# Patient Record
Sex: Male | Born: 1960 | Race: White | Hispanic: No | Marital: Married | Smoking: Former smoker
Health system: Southern US, Community
[De-identification: ages and names within clinical notes are randomized; demographics above are authoritative.]

## PROBLEM LIST (undated history)

## (undated) DIAGNOSIS — Z972 Presence of dental prosthetic device (complete) (partial): Secondary | ICD-10-CM

## (undated) DIAGNOSIS — F32A Depression, unspecified: Secondary | ICD-10-CM

## (undated) DIAGNOSIS — R519 Headache, unspecified: Secondary | ICD-10-CM

## (undated) DIAGNOSIS — K08109 Complete loss of teeth, unspecified cause, unspecified class: Secondary | ICD-10-CM

## (undated) DIAGNOSIS — K219 Gastro-esophageal reflux disease without esophagitis: Secondary | ICD-10-CM

## (undated) DIAGNOSIS — Z973 Presence of spectacles and contact lenses: Secondary | ICD-10-CM

## (undated) DIAGNOSIS — F329 Major depressive disorder, single episode, unspecified: Secondary | ICD-10-CM

## (undated) DIAGNOSIS — R51 Headache: Secondary | ICD-10-CM

## (undated) DIAGNOSIS — G8929 Other chronic pain: Secondary | ICD-10-CM

## (undated) HISTORY — DX: Headache: R51

## (undated) HISTORY — DX: Headache, unspecified: R51.9

## (undated) HISTORY — PX: WISDOM TOOTH EXTRACTION: SHX21

## (undated) HISTORY — DX: Complete loss of teeth, unspecified cause, unspecified class: K08.109

## (undated) HISTORY — PX: POLYPECTOMY: SHX149

## (undated) HISTORY — DX: Depression, unspecified: F32.A

## (undated) HISTORY — DX: Major depressive disorder, single episode, unspecified: F32.9

## (undated) HISTORY — DX: Complete loss of teeth, unspecified cause, unspecified class: Z97.2

## (undated) HISTORY — DX: Presence of spectacles and contact lenses: Z97.3

## (undated) HISTORY — DX: Gastro-esophageal reflux disease without esophagitis: K21.9

## (undated) HISTORY — DX: Other chronic pain: G89.29

---
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
## 1998-03-05 ENCOUNTER — Emergency Department (HOSPITAL_COMMUNITY): Admission: EM | Admit: 1998-03-05 | Discharge: 1998-03-05 | Payer: Self-pay | Admitting: Emergency Medicine

## 1999-05-09 ENCOUNTER — Encounter: Payer: Self-pay | Admitting: Family Medicine

## 1999-05-09 ENCOUNTER — Ambulatory Visit (HOSPITAL_COMMUNITY): Admission: RE | Admit: 1999-05-09 | Discharge: 1999-05-09 | Payer: Self-pay | Admitting: Family Medicine

## 1999-05-13 ENCOUNTER — Ambulatory Visit (HOSPITAL_COMMUNITY): Admission: RE | Admit: 1999-05-13 | Discharge: 1999-05-13 | Payer: Self-pay | Admitting: Family Medicine

## 1999-05-13 ENCOUNTER — Encounter: Payer: Self-pay | Admitting: Family Medicine

## 1999-10-26 ENCOUNTER — Emergency Department (HOSPITAL_COMMUNITY): Admission: EM | Admit: 1999-10-26 | Discharge: 1999-10-26 | Payer: Self-pay | Admitting: *Deleted

## 2000-10-01 ENCOUNTER — Emergency Department (HOSPITAL_COMMUNITY): Admission: EM | Admit: 2000-10-01 | Discharge: 2000-10-02 | Payer: Self-pay | Admitting: *Deleted

## 2003-07-22 ENCOUNTER — Emergency Department (HOSPITAL_COMMUNITY): Admission: EM | Admit: 2003-07-22 | Discharge: 2003-07-22 | Payer: Self-pay | Admitting: Emergency Medicine

## 2009-10-21 ENCOUNTER — Emergency Department (HOSPITAL_COMMUNITY): Admission: EM | Admit: 2009-10-21 | Discharge: 2009-10-21 | Payer: Self-pay | Admitting: Internal Medicine

## 2012-11-07 ENCOUNTER — Encounter: Payer: Self-pay | Admitting: Medical

## 2012-11-22 ENCOUNTER — Encounter: Payer: Self-pay | Admitting: Medical

## 2012-11-22 ENCOUNTER — Ambulatory Visit (INDEPENDENT_AMBULATORY_CARE_PROVIDER_SITE_OTHER): Payer: BC Managed Care – PPO | Admitting: Medical

## 2012-11-22 ENCOUNTER — Encounter: Payer: Self-pay | Admitting: Gastroenterology

## 2012-11-22 VITALS — BP 110/78 | HR 78 | Temp 98.2°F | Resp 16 | Ht 70.0 in | Wt 142.0 lb

## 2012-11-22 DIAGNOSIS — Z0000 Encounter for general adult medical examination without abnormal findings: Secondary | ICD-10-CM

## 2012-11-22 DIAGNOSIS — R51 Headache: Secondary | ICD-10-CM

## 2012-11-22 DIAGNOSIS — Z1211 Encounter for screening for malignant neoplasm of colon: Secondary | ICD-10-CM

## 2012-11-22 LAB — COMPREHENSIVE METABOLIC PANEL
ALT: 39 U/L (ref 0–53)
AST: 33 U/L (ref 0–37)
Albumin: 4.2 g/dL (ref 3.5–5.2)
Alkaline Phosphatase: 81 U/L (ref 39–117)
BUN: 21 mg/dL (ref 6–23)
CO2: 30 mEq/L (ref 19–32)
Chloride: 101 mEq/L (ref 96–112)
Creat: 0.94 mg/dL (ref 0.50–1.35)
Glucose, Bld: 93 mg/dL (ref 70–99)
Potassium: 4.3 mEq/L (ref 3.5–5.3)
Sodium: 140 mEq/L (ref 135–145)
Total Bilirubin: 0.6 mg/dL (ref 0.3–1.2)
Total Protein: 6.4 g/dL (ref 6.0–8.3)

## 2012-11-22 LAB — CBC WITH DIFFERENTIAL/PLATELET
Basophils Absolute: 0 10*3/uL (ref 0.0–0.1)
Basophils Relative: 0 % (ref 0–1)
Eosinophils Absolute: 0.1 10*3/uL (ref 0.0–0.7)
Eosinophils Relative: 3 % (ref 0–5)
HCT: 45.4 % (ref 39.0–52.0)
Hemoglobin: 15.9 g/dL (ref 13.0–17.0)
Lymphocytes Relative: 42 % (ref 12–46)
Lymphs Abs: 2.2 10*3/uL (ref 0.7–4.0)
MCH: 29.2 pg (ref 26.0–34.0)
MCHC: 35 g/dL (ref 30.0–36.0)
MCV: 83.5 fL (ref 78.0–100.0)
Monocytes Absolute: 0.3 10*3/uL (ref 0.1–1.0)
Monocytes Relative: 5 % (ref 3–12)
Neutro Abs: 2.6 10*3/uL (ref 1.7–7.7)
Neutrophils Relative %: 50 % (ref 43–77)
Platelets: 261 10*3/uL (ref 150–400)
RBC: 5.44 MIL/uL (ref 4.22–5.81)
RDW: 13.6 % (ref 11.5–15.5)
WBC: 5.2 10*3/uL (ref 4.0–10.5)

## 2012-11-22 LAB — POCT URINALYSIS DIPSTICK
Bilirubin, UA: NEGATIVE
Blood, UA: NEGATIVE
Glucose, UA: NEGATIVE
Ketones, UA: NEGATIVE
Leukocytes, UA: NEGATIVE
Protein, UA: NEGATIVE
Spec Grav, UA: 1.015
Urobilinogen, UA: NEGATIVE
pH, UA: 5

## 2012-11-22 LAB — TSH: TSH: 2.226 u[IU]/mL (ref 0.350–4.500)

## 2012-11-22 LAB — LIPID PANEL
Cholesterol: 173 mg/dL (ref 0–200)
HDL: 43 mg/dL (ref >39)
LDL Cholesterol: 114 mg/dL — ABNORMAL HIGH (ref 0–99)
Total CHOL/HDL Ratio: 4 Ratio
Triglycerides: 82 mg/dL (ref <150)
VLDL: 16 mg/dL (ref 0–40)

## 2012-11-22 NOTE — Patient Instructions (Signed)
Start keeping a headache diary.   Gradually cut back on caffeine intake, and try and get more sleep.    Lets see you back in 1-2 months regarding the headaches.    Preventative Care for Adults, Male       REGULAR HEALTH EXAMS:  A routine yearly physical is a good way to check in with your primary care provider about your health and preventive screening. It is also an opportunity to share updates about your health and any concerns you have, and receive a thorough all-over exam.   Most health insurance companies pay for at least some preventative services.  Check with your health plan for specific coverages.  WHAT PREVENTATIVE SERVICES DO MEN NEED?  Adult men should have their weight and blood pressure checked regularly.   Men age 82 and older should have their cholesterol levels checked regularly.  Beginning at age 40 and continuing to age 43, men should be screened for colorectal cancer.  Certain people should may need continued testing until age 27.  Other cancer screening may include exams for testicular and prostate cancer.  Updating vaccinations is part of preventative care.  Vaccinations help protect against diseases such as the flu.  Lab tests are generally done as part of preventative care to screen for anemia and blood disorders, to screen for problems with the kidneys and liver, to screen for bladder problems, to check blood sugar, and to check your cholesterol level.  Preventative services generally include counseling about diet, exercise, avoiding tobacco, drugs, excessive alcohol consumption, and sexually transmitted infections.    GENERAL RECOMMENDATIONS FOR GOOD HEALTH:  Healthy diet:  Eat a variety of foods, including fruit, vegetables, animal or vegetable protein, such as meat, fish, chicken, and eggs, or beans, lentils, tofu, and grains, such as rice.  Drink plenty of water daily.  Decrease saturated fat in the diet, avoid lots of red meat, processed foods,  sweets, fast foods, and fried foods.  Exercise:  Aerobic exercise helps maintain good heart health. At least 30-40 minutes of moderate-intensity exercise is recommended. For example, a brisk walk that increases your heart rate and breathing. This should be done on most days of the week.   Find a type of exercise or a variety of exercises that you enjoy so that it becomes a part of your daily life.  Examples are running, walking, swimming, water aerobics, and biking.  For motivation and support, explore group exercise such as aerobic class, spin class, Zumba, Yoga,or  martial arts, etc.    Set exercise goals for yourself, such as a certain weight goal, walk or run in a race such as a 5k walk/run.  Speak to your primary care provider about exercise goals.  Disease prevention:  If you smoke or chew tobacco, find out from your caregiver how to quit. It can literally save your life, no matter how long you have been a tobacco user. If you do not use tobacco, never begin.   Maintain a healthy diet and normal weight. Increased weight leads to problems with blood pressure and diabetes.   The Body Mass Index or BMI is a way of measuring how much of your body is fat. Having a BMI above 27 increases the risk of heart disease, diabetes, hypertension, stroke and other problems related to obesity. Your caregiver can help determine your BMI and based on it develop an exercise and dietary program to help you achieve or maintain this important measurement at a healthful level.  High blood  pressure causes heart and blood vessel problems.  Persistent high blood pressure should be treated with medicine if weight loss and exercise do not work.   Fat and cholesterol leaves deposits in your arteries that can block them. This causes heart disease and vessel disease elsewhere in your body.  If your cholesterol is found to be high, or if you have heart disease or certain other medical conditions, then you may need to have  your cholesterol monitored frequently and be treated with medication.   Ask if you should have a stress test if your history suggests this. A stress test is a test done on a treadmill that looks for heart disease. This test can find disease prior to there being a problem.  Avoid drinking alcohol in excess (more than two drinks per day).  Avoid use of street drugs. Do not share needles with anyone. Ask for professional help if you need assistance or instructions on stopping the use of alcohol, cigarettes, and/or drugs.  Brush your teeth twice a day with fluoride toothpaste, and floss once a day. Good oral hygiene prevents tooth decay and gum disease. The problems can be painful, unattractive, and can cause other health problems. Visit your dentist for a routine oral and dental check up and preventive care every 6-12 months.   Look at your skin regularly.  Use a mirror to look at your back. Notify your caregivers of changes in moles, especially if there are changes in shapes, colors, a size larger than a pencil eraser, an irregular border, or development of new moles.  Safety:  Use seatbelts 100% of the time, whether driving or as a passenger.  Use safety devices such as hearing protection if you work in environments with loud noise or significant background noise.  Use safety glasses when doing any work that could send debris in to the eyes.  Use a helmet if you ride a bike or motorcycle.  Use appropriate safety gear for contact sports.  Talk to your caregiver about gun safety.  Use sunscreen with a SPF (or skin protection factor) of 15 or greater.  Lighter skinned people are at a greater risk of skin cancer. Don't forget to also wear sunglasses in order to protect your eyes from too much damaging sunlight. Damaging sunlight can accelerate cataract formation.   Practice safe sex. Use condoms. Condoms are used for birth control and to help reduce the spread of sexually transmitted infections (or STIs).   Some of the STIs are gonorrhea (the clap), chlamydia, syphilis, trichomonas, herpes, HPV (human papilloma virus) and HIV (human immunodeficiency virus) which causes AIDS. The herpes, HIV and HPV are viral illnesses that have no cure. These can result in disability, cancer and death.   Keep carbon monoxide and smoke detectors in your home functioning at all times. Change the batteries every 6 months or use a model that plugs into the wall.   Vaccinations:  Stay up to date with your tetanus shots and other required immunizations. You should have a booster for tetanus every 10 years. Be sure to get your flu shot every year, since 5%-20% of the [place] population comes down with the flu. The flu vaccine changes each year, so being vaccinated once is not enough. Get your shot in the fall, before the flu season peaks.   Other vaccines to consider:  Pneumococcal vaccine to protect against certain types of pneumonia.  This is normally recommended for adults age 87 or older.  However, adults younger than 51  years old with certain underlying conditions such as diabetes, heart or lung disease should also receive the vaccine.  Shingles vaccine to protect against Varicella Zoster if you are older than age 40, or younger than 51 years old with certain underlying illness.  Hepatitis A vaccine to protect against a form of infection of the liver by a virus acquired from food.  Hepatitis B vaccine to protect against a form of infection of the liver by a virus acquired from blood or body fluids, particularly if you work in health care.  If you plan to travel internationally, check with your local health department for specific vaccination recommendations.  Cancer Screening:  Most routine colon cancer screening begins at the age of 41. On a yearly basis, doctors may provide special easy to use take-home tests to check for hidden blood in the stool. Sigmoidoscopy or colonoscopy can detect the earliest forms of colon  cancer and is life saving. These tests use a small camera at the end of a tube to directly examine the colon. Speak to your caregiver about this at age 67, when routine screening begins (and is repeated every 5 years unless early forms of pre-cancerous polyps or small growths are found).   At the age of 9 men usually start screening for prostate cancer every year. Screening may begin at a younger age for those with higher risk. Those at higher risk include African-Americans or having a family history of prostate cancer. There are two types of tests for prostate cancer:   Prostate-specific antigen (PSA) testing. Recent studies raise questions about prostate cancer using PSA and you should discuss this with your caregiver.   Digital rectal exam (in which your doctor's lubricated and gloved finger feels for enlargement of the prostate through the anus).   Screening for testicular cancer.  Do a monthly exam of your testicles. Gently roll each testicle between your thumb and fingers, feeling for any abnormal lumps. The best time to do this is after a hot shower or bath when the tissues are looser. Notify your caregivers of any lumps, tenderness or changes in size or shape immediately.

## 2012-11-22 NOTE — Progress Notes (Signed)
Subjective:   HPI  Justin Sellers is a 52 y.o. male who presents for a complete physical.  New patient today.  No prior recent medical care.   Preventative care: Last ophthalmology visit:08/2011-Dr. Gildardo Griffes [place] Last dental visit:Dr. Hyacinth Meeker Last colonoscopy: never Last prostate exam: never  Prior vaccinations: TD or Tdap:within 5 years Influenza:n/a Pneumococcal:n/a Shingles/Zostavax:n/a  Advanced directive:n/a Health care power of attorney:n/a Living will:n/a  Concerns: Chronic headaches.   Gets migraines every week, also has sinus headaches.   Has had this problem for at least 5 years.  Seems to be getting worse, gradually x last 5 years.   Headaches are usually right sided temporal, and sinus headaches in frontal area.  Feels like someone hitting him with hammer.  No photophobia, sometimes + phonophobia.  No fevers, no night sweats. At times gets nosebleeds, intermittent.   No frank numbness or tingling, but legs in general feel weak at times.   No hearing or vision changes.  headaches triggered by sleep issues.   Only gets 4 hours of sleep nightly for years.   No blood in stool.   Drinks 2+ pots of coffee daily, coffee at work, and coke during the day.  Has some nasal congestion.    Reviewed their medical, surgical, family, social, medication, and allergy history and updated chart as appropriate.   Past Medical History  Diagnosis Date  . Wears glasses   . Full dentures   . Chronic headache     History reviewed. No pertinent past surgical history.  Family History  Problem Relation Age of Onset  . Heart disease Mother 75    CABG  . Diabetes Mother   . Cancer Father     lung  . Hypertension Father   . Thyroid disease Sister   . Heart disease Paternal Grandmother   . Cancer Paternal Grandfather     lung  . Thyroid disease Sister     History   Social History  . Marital Status: Married    Spouse Name: N/A    Number of Children: N/A  . Years of Education: N/A    Occupational History  . Not on file.   Social History Main Topics  . Smoking status: Former Smoker -- 16.00 packs/day for 1.5 years    Types: Cigarettes    Quit date: 11/23/2010  . Smokeless tobacco: Not on file  . Alcohol Use: No  . Drug Use: No  . Sexually Active: Not on file   Other Topics Concern  . 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   No current facility-administered medications on file prior to visit.    No Known Allergies   Review of Systems Constitutional: -fever, -chills, -sweats, -unexpected weight change, -decreased appetite, -fatigue Allergy: -sneezing, -itching, -congestion Dermatology: -changing moles, --rash, +lumps ENT: -runny nose, -ear pain, -sore throat, -hoarseness, +sinus pain, -teeth pain, - ringing in ears, -hearing loss, -nosebleeds Cardiology: -chest pain, -palpitations, -swelling, -difficulty breathing when lying flat, -waking up short of breath Respiratory: -cough, -shortness of breath, -difficulty breathing with exercise or exertion, -wheezing, -coughing up blood Gastroenterology: -abdominal pain, -nausea, -vomiting, -diarrhea, -constipation, -blood in stool, -changes in bowel movement, -difficulty swallowing or eating Hematology: -bleeding, -bruising  Musculoskeletal: -joint aches, -muscle aches, -joint swelling, +back pain, -neck pain, -cramping, -changes in gait Ophthalmology: denies vision changes, eye redness,  itching, discharge Urology: -burning with urination, -difficulty urinating, -blood in urine, -urinary frequency, -urgency, -incontinence Neurology: +headache, -weakness, -tingling, -numbness, -memory loss, -falls, +dizziness Psychology: -depressed mood, -agitation, +sleep problems     Objective:   Physical Exam  Vitals and nurse notes reviewed  General  appearance: alert, no distress, WD/WN, lean white male Skin: scattered benign appearing lesions, no worrisome lesions HEENT: normocephalic, conjunctiva/corneas normal, sclerae anicteric, PERRLA, EOMi, nares patent, right nare bordering septum with raised 3mm growth unchnaged for years per pt, flesh colored, no discharge or erythema, pharynx normal Oral cavity: MMM, tongue normal, full dentures Neck: supple, no lymphadenopathy, no thyromegaly, no masses, normal ROM, no bruits Chest: non tender, normal shape and expansion Heart: RRR, normal S1, S2, no murmurs Lungs: CTA bilaterally, no wheezes, rhonchi, or rales Abdomen: +bs, soft, non tender, non distended, no masses, no hepatomegaly, no splenomegaly, no bruits Back: non tender, normal ROM, no scoliosis Musculoskeletal: upper extremities non tender, no obvious deformity, normal ROM throughout, lower extremities non tender, no obvious deformity, normal ROM throughout Extremities: no edema, no cyanosis, no clubbing Pulses: 2+ symmetric, upper and lower extremities, normal cap refill Neurological: alert, oriented x 3, CN2-12 intact, strength normal upper extremities and lower extremities, sensation normal throughout, DTRs 2+ throughout, no cerebellar signs, gait normal Psychiatric: normal affect, behavior normal, pleasant  GU: normal male external genitalia, nontender, no masses, no hernia, no lymphadenopathy Rectal: anus normal, tone normal, prostate WNL, occult negative stool   Adult ECG Report  Indication: physical  Rate: 62 bpm  Rhythm: normal sinus rhythm  QRS Axis: 78 degrees  PR Interval: 156 ms  QRS Duration: 84ms  QTc:  Conduction Disturbances: none  Other Abnormalities: none  Patient's cardiac risk factors are: male gender, mother with premature cardiac disease  EKG comparison: none  Narrative Interpretation: normal EKG    Assessment and Plan :    Encounter Diagnoses  Name Primary?  . Routine general medical  examination at a health care facility Yes  . Special screening for malignant neoplasms, colon   . Chronic headache     Physical exam - discussed healthy lifestyle, diet, exercise, preventative care, vaccinations, and addressed their concerns.    Referral for first screening colonoscopy  Chronic headache - begin keeping headache diary, gradually start cutting back on caffeine as discussed, try and get more sleep daily.  Recheck 1-2 mo.  Follow-up pending labs

## 2012-12-23 ENCOUNTER — Ambulatory Visit (AMBULATORY_SURGERY_CENTER): Payer: BC Managed Care – HMO | Admitting: *Deleted

## 2012-12-23 VITALS — Ht 69.0 in | Wt 144.0 lb

## 2012-12-23 DIAGNOSIS — Z1211 Encounter for screening for malignant neoplasm of colon: Secondary | ICD-10-CM

## 2012-12-23 MED ORDER — SUPREP BOWEL PREP KIT 17.5-3.13-1.6 GM/177ML PO SOLN: ORAL | Status: DC

## 2012-12-26 ENCOUNTER — Encounter: Payer: Self-pay | Admitting: Gastroenterology

## 2012-12-27 ENCOUNTER — Ambulatory Visit (INDEPENDENT_AMBULATORY_CARE_PROVIDER_SITE_OTHER): Payer: BC Managed Care – PPO | Admitting: Medical

## 2012-12-27 ENCOUNTER — Encounter: Payer: Self-pay | Admitting: Medical

## 2012-12-27 VITALS — BP 102/78 | HR 60 | Temp 97.8°F | Resp 16 | Wt 145.0 lb

## 2012-12-27 DIAGNOSIS — M7702 Medial epicondylitis, left elbow: Secondary | ICD-10-CM

## 2012-12-27 DIAGNOSIS — M7700 Medial epicondylitis, unspecified elbow: Secondary | ICD-10-CM

## 2012-12-27 DIAGNOSIS — R51 Headache: Secondary | ICD-10-CM

## 2012-12-27 DIAGNOSIS — M7020 Olecranon bursitis, unspecified elbow: Secondary | ICD-10-CM

## 2012-12-27 DIAGNOSIS — D1720 Benign lipomatous neoplasm of skin and subcutaneous tissue of unspecified limb: Secondary | ICD-10-CM

## 2012-12-27 DIAGNOSIS — I781 Nevus, non-neoplastic: Secondary | ICD-10-CM

## 2012-12-27 DIAGNOSIS — D1739 Benign lipomatous neoplasm of skin and subcutaneous tissue of other sites: Secondary | ICD-10-CM

## 2012-12-27 DIAGNOSIS — M7022 Olecranon bursitis, left elbow: Secondary | ICD-10-CM

## 2012-12-27 MED ORDER — DICLOFENAC SODIUM 75 MG PO TBEC: 75.0000 mg | DELAYED_RELEASE_TABLET | Freq: Two times a day (BID) | ORAL | Status: DC

## 2012-12-27 NOTE — Progress Notes (Signed)
Subjective:   HPI  Justin Sellers is a 52 y.o. male who presents for on chronic headaches.   Gets migraines every week, also has sinus headaches.   Has had this problem for at least 5 years.  Was getting regular headaches, but since last visit has cut down on caffeine and tried to work on sleep.  Doing better.  Not interested in medication for headaches at this time.   No photophobia, sometimes + phonophobia.  No fevers, no night sweats. At times gets nosebleeds, intermittent.   No frank numbness or tingling, but legs in general feel weak at times.   No hearing or vision changes.   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  Has concerns about his arm.  He reports lump in left shoulder and seeing some new skin lesions on chest, 3 different ones.   Wants these checked out.    Gets pains and weakness of left elbow, uses compression sleeve on the arm.  No other prior eval, no other aggravating or relieving factors.   Reviewed their medical, surgical, family, social, medication, and allergy history and updated chart as appropriate.  ROS as in subjective   Past Medical History  Diagnosis Date  . Wears glasses   . Full dentures   . Chronic headache     Objective:   Physical Exam  Vitals and nurse notes reviewed  General appearance: alert, no distress, WD/WN Neck: supple, no lymphadenopathy, no thyromegaly, no masses, normal ROM Pulses: 2+ symmetric, upper and lower extremities, normal cap refill Neurological: UE normal strength, DTRs, sensation MSK: mild tenderness left medial epicondyle and olecranon bursa, otherwise nontender, normal ROM, no other obvious deformity of UE   Assessment and Plan :    Encounter Diagnoses  Name Primary?  . Epicondylitis elbow, medial, left Yes  . Olecranon bursitis of left elbow   . Cherry angioma   . Lipoma of shoulder   . Chronic headaches     Epicondylitis and bursitis - mild.   Advised rest, ice, compression, Diclofenac script today, and recheck in  2-3 wk if not improving.  Handout given  Cherry angioma - reassured  Lipoma - small, reassured, advised no intervention at this time  chronic headaches - improved with less caffeine, sleep hygiene

## 2012-12-27 NOTE — Patient Instructions (Addendum)
The left shoulder lump is a lipoma.  A fatty benign tumor.  I would ignore this.  The red lesions of the chest are called cherry angiomas.  These are benign, nothing to worry about.  Left elbow issues - epicondylitis and bursitis.   Recommendations:  Diclofenac twice daily for 3-5 days as needed for flare up  Use ice, rest, the compression sleeve you have been using and lets see if this doesn't calm it down  If not improving in 2 weeks, we can consider therapy vs other treatment    Medial Epicondylitis (Golfer's Elbow) with Rehab Medial epicondylitis involves inflammation and pain around the inner (medial) portion of the elbow. This pain is caused by inflammation of the tendons in the forearm that flex (bring down) the wrist. Medial epicondylitis is also called golfer's elbow, because it is common among golfers. However, it may occur in any individual who flexes the wrist regularly. If medial epicondylitis is left untreated, it may become a chronic problem. SYMPTOMS  Pain, tenderness, or inflammation over the inner (medial) side of the elbow. Pain or weakness with gripping activities. Pain that increases with wrist twisting motions (using a screwdriver, playing golf, bowling). CAUSES  Medial epicondylitis is caused by inflammation of the tendons that flex the wrist. Causes of injury may include: Chronic, repetitive stress and strain to the tendons that run from the wrist and forearm to the elbow. Sudden strain on the forearm, including wrist snap when serving balls with racquet sports, or throwing a baseball. RISK INCREASES WITH: Sports or occupations that require repetitive and/or strenuous forearm and wrist movements (pitching a baseball, golfing, carpentry). Poor wrist and forearm strength and flexibility. Failure to warm up properly before activity. Resuming activity before healing, rehabilitation, and conditioning are complete. PREVENTION  Warm up and stretch properly before  activity. Maintain physical fitness: Strength, flexibility, and endurance. Cardiovascular fitness. Wear and use properly fitted equipment. Learn and use proper technique and have a coach correct improper technique. Wear a tennis elbow (counterforce) brace. PROGNOSIS  The course of this condition depends on the degree of the injury. If treated properly, acute cases (symptoms lasting less than 4 weeks) are often resolved in 2 to 6 weeks. Chronic (longer lasting cases) often resolve in 3 to 6 months, but may require physical therapy. RELATED COMPLICATIONS  Frequently recurring symptoms, resulting in a chronic problem. Properly treating the problem the first time decreases frequency of recurrence. Chronic inflammation, scarring, and partial tendon tear, requiring surgery. Delayed healing or resolution of symptoms. TREATMENT  Treatment first involves the use of ice and medicine, to reduce pain and inflammation. Strengthening and stretching exercises may reduce discomfort, if performed regularly. These exercises may be performed at home, if the condition is an acute injury. Chronic cases may require a referral to a physical therapist for evaluation and treatment. Your caregiver may advise a corticosteroid injection to help reduce inflammation. Rarely, surgery is needed. MEDICATION If pain medicine is needed, nonsteroidal anti-inflammatory medicines (aspirin and ibuprofen), or other minor pain relievers (acetaminophen), are often advised. Do not take pain medicine for 7 days before surgery. Prescription pain relievers may be given, if your caregiver thinks they are needed. Use only as directed and only as much as you need. Corticosteroid injections may be recommended. These injections should be reserved only for the most severe cases, because they can only be given a certain number of times. HEAT AND COLD Cold treatment (icing) should be applied for 10 to 15 minutes every 2 to  3 hours for inflammation  and pain, and immediately after activity that aggravates your symptoms. Use ice packs or an ice massage. Heat treatment may be used before performing stretching and strengthening activities prescribed by your caregiver, physical therapist, or athletic trainer. Use a heat pack or a warm water soak. SEEK MEDICAL CARE IF: Symptoms get worse or do not improve in 2 weeks, despite treatment. EXERCISES     Olecranon Bursitis Bursitis is swelling and soreness (inflammation) of a fluid-filled sac (bursa) that covers and protects a joint. Olecranon bursitis occurs over the elbow.  CAUSES Bursitis can be caused by injury, overuse of the joint, arthritis, or infection.  SYMPTOMS   Tenderness, swelling, warmth, or redness over the elbow.  Elbow pain with movement. This is greater with bending the elbow.  Squeaking sound when the bursa is rubbed or moved.  Increasing size of the bursa without pain or discomfort.  Fever with increasing pain and swelling if the bursa becomes infected. HOME CARE INSTRUCTIONS   Put ice on the affected area.  Put ice in a plastic bag.  Place a towel between your skin and the bag.  Leave the ice on for 15 to 20 minutes each hour while awake. Do this for the first 2 days.  When resting, elevate your elbow above the level of your heart. This helps reduce swelling.  Continue to put the joint through a full range of motion 4 times per day. Rest the injured joint at other times. When the pain lessens, begin normal slow movements and usual activities.  Only take over-the-counter or prescription medicines for pain, discomfort, or fever as directed by your caregiver.  Reduce your intake of milk and related dairy products (cheese, yogurt). They may make your condition worse. SEEK IMMEDIATE MEDICAL CARE IF:   Your pain increases even during treatment.  You have a fever.  You have heat and inflammation over the bursa and elbow.  You have a red line that goes up your  arm.  You have pain with movement of your elbow. MAKE SURE YOU:   Understand these instructions.  Will watch your condition.  Will get help right away if you are not doing well or get worse. Document Released: 09/09/2006 Document Revised: 11/02/2011 Document Reviewed: 07/26/2007 [facility] Patient Information 2013 [place], [place].

## 2013-01-06 ENCOUNTER — Encounter: Payer: BC Managed Care – HMO | Admitting: Gastroenterology

## 2013-03-09 ENCOUNTER — Encounter: Payer: BC Managed Care – HMO | Admitting: Gastroenterology

## 2013-03-09 ENCOUNTER — Telehealth: Payer: Self-pay | Admitting: Gastroenterology

## 2013-03-09 NOTE — Telephone Encounter (Signed)
Yes

## 2013-03-10 ENCOUNTER — Encounter: Payer: Self-pay | Admitting: Gastroenterology

## 2013-03-21 ENCOUNTER — Encounter: Payer: BC Managed Care – HMO | Admitting: Gastroenterology

## 2013-05-08 ENCOUNTER — Encounter: Payer: Self-pay | Admitting: Gastroenterology

## 2013-05-08 ENCOUNTER — Ambulatory Visit (AMBULATORY_SURGERY_CENTER): Payer: Self-pay | Admitting: *Deleted

## 2013-05-08 VITALS — Ht 69.0 in | Wt 147.8 lb

## 2013-05-08 DIAGNOSIS — Z1211 Encounter for screening for malignant neoplasm of colon: Secondary | ICD-10-CM

## 2013-05-08 MED ORDER — NA SULFATE-K SULFATE-MG SULF 17.5-3.13-1.6 GM/177ML PO SOLN: 1.0000 | Freq: Once | ORAL | Status: DC

## 2013-05-08 NOTE — Progress Notes (Signed)
No egg or soy allergy. ewm Pt with no past hx of sedation. ewm No home 02 use and no CPAP use. ewm No previous GI history. ewm emmi video to pt's e mail. ewm

## 2013-05-16 ENCOUNTER — Ambulatory Visit: Payer: BC Managed Care – PPO | Admitting: Medical

## 2013-05-22 ENCOUNTER — Ambulatory Visit (AMBULATORY_SURGERY_CENTER): Payer: BC Managed Care – HMO | Admitting: Gastroenterology

## 2013-05-22 ENCOUNTER — Encounter: Payer: Self-pay | Admitting: Gastroenterology

## 2013-05-22 VITALS — BP 102/61 | HR 63 | Temp 97.2°F | Resp 16 | Ht 69.0 in | Wt 147.0 lb

## 2013-05-22 DIAGNOSIS — D126 Benign neoplasm of colon, unspecified: Secondary | ICD-10-CM

## 2013-05-22 DIAGNOSIS — Z1211 Encounter for screening for malignant neoplasm of colon: Secondary | ICD-10-CM

## 2013-05-22 DIAGNOSIS — K5730 Diverticulosis of large intestine without perforation or abscess without bleeding: Secondary | ICD-10-CM

## 2013-05-22 HISTORY — PX: COLONOSCOPY: SHX174

## 2013-05-22 MED ORDER — FLEET ENEMA 7-19 GM/118ML RE ENEM
1.0000 | ENEMA | Freq: Once | RECTAL | Status: AC
  Administered 2013-05-22: 1 via RECTAL

## 2013-05-22 MED ORDER — SODIUM CHLORIDE 0.9 % IV SOLN: 500.0000 mL | INTRAVENOUS | Status: DC

## 2013-05-22 NOTE — Progress Notes (Signed)
Patient complained of bleeding after giving himself enema.  No blood seen in toilet.  Results were clear.

## 2013-05-22 NOTE — Progress Notes (Signed)
Patient did not experience any of the following events: a burn prior to discharge; a fall within the facility; wrong site/side/patient/procedure/implant event; or a hospital transfer or hospital admission upon discharge from the facility. (G8907) Patient did not have preoperative order for IV antibiotic SSI prophylaxis. (G8918)  

## 2013-05-22 NOTE — Patient Instructions (Addendum)
YOU HAD AN ENDOSCOPIC PROCEDURE TODAY AT THE  ENDOSCOPY CENTER: Refer to the procedure report that was given to you for any specific questions about what was found during the examination.  If the procedure report does not answer your questions, please call your gastroenterologist to clarify.  If you requested that your care partner not be given the details of your procedure findings, then the procedure report has been included in a sealed envelope for you to review at your convenience later.  YOU SHOULD EXPECT: Some feelings of bloating in the abdomen. Passage of more gas than usual.  Walking can help get rid of the air that was put into your GI tract during the procedure and reduce the bloating. If you had a lower endoscopy (such as a colonoscopy or flexible sigmoidoscopy) you may notice spotting of blood in your stool or on the toilet paper. If you underwent a bowel prep for your procedure, then you may not have a normal bowel movement for a few days.  DIET: Your first meal following the procedure should be a light meal and then it is ok to progress to your normal diet.  A half-sandwich or bowl of soup is an example of a good first meal.  Heavy or fried foods are harder to digest and may make you feel nauseous or bloated.  Likewise meals heavy in dairy and vegetables can cause extra gas to form and this can also increase the bloating.  Drink plenty of fluids but you should avoid alcoholic beverages for 24 hours.  ACTIVITY: Your care partner should take you home directly after the procedure.  You should plan to take it easy, moving slowly for the rest of the day.  You can resume normal activity the day after the procedure however you should NOT DRIVE or use heavy machinery for 24 hours (because of the sedation medicines used during the test).    SYMPTOMS TO REPORT IMMEDIATELY: A gastroenterologist can be reached at any hour.  During normal business hours, 8:30 AM to 5:00 PM Monday through Friday,  call [phone].  After hours and on weekends, please call the GI answering service at [phone] who will take a message and have the physician on call contact you.   Following lower endoscopy (colonoscopy or flexible sigmoidoscopy):  Excessive amounts of blood in the stool  Significant tenderness or worsening of abdominal pains  Swelling of the abdomen that is new, acute  Fever of 100F or higher   FOLLOW UP: If any biopsies were taken you will be contacted by phone or by letter within the next 1-3 weeks.  Call your gastroenterologist if you have not heard about the biopsies in 3 weeks.  Our staff will call the home number listed on your records the next business day following your procedure to check on you and address any questions or concerns that you may have at that time regarding the information given to you following your procedure. This is a courtesy call and so if there is no answer at the home number and we have not heard from you through the emergency physician on call, we will assume that you have returned to your regular daily activities without incident.  SIGNATURES/CONFIDENTIALITY: You and/or your care partner have signed paperwork which will be entered into your electronic medical record.  These signatures attest to the fact that that the information above on your After Visit Summary has been reviewed and is understood.  Full responsibility of the confidentiality of   this discharge information lies with you and/or your care-partner.   Resume medications. Information given on polyps,diverticulosis and high fiber diet with discharge instructions. 

## 2013-05-22 NOTE — Op Note (Signed)
[facility] [address] [place], [address]   COLONOSCOPY PROCEDURE REPORT  PATIENT: Justin, Sellers  MR#: [id number] BIRTHDATE: 02/25/1961 , 51  yrs. old GENDER: Male ENDOSCOPIST: Louis Meckel, MD REFERRED XB:JYNWG Tysinger, PA-C PROCEDURE DATE:  05/22/2013 PROCEDURE:   Colonoscopy with snare polypectomy First Screening Colonoscopy - Avg.  risk and is 50 yrs.  old or older Yes.  Prior Negative Screening - Now for repeat screening. N/A  History of Adenoma - Now for follow-up colonoscopy & has been > or = to 3 yrs.  N/A  Polyps Removed Today? Yes. ASA CLASS:   Class II INDICATIONS:Average risk patient for colon cancer. MEDICATIONS: MAC sedation, administered by CRNA and propofol (Diprivan) 200mg  IV  DESCRIPTION OF PROCEDURE:   After the risks benefits and alternatives of the procedure were thoroughly explained, informed consent was obtained.  A digital rectal exam revealed no abnormalities of the rectum.   The LB [id number]  endoscope was introduced through the anus and advanced to the cecum, which was identified by both the appendix and ileocecal valve. No adverse events experienced.   The quality of the prep was Suprep good  The instrument was then slowly withdrawn as the colon was fully examined.      COLON FINDINGS: A semi-pedunculated friable polyp measuring 1 cm in size was found in the proximal sigmoid colon.  A polypectomy was performed using snare cautery.  The resection was complete and the polyp tissue was completely retrieved.   Mild diverticulosis was noted in the ascending colon.   The colon mucosa was otherwise normal.  Retroflexed views revealed no abnormalities. The time to cecum=1 minutes 52 seconds.  Withdrawal time=12 minutes .30 seconds.  The scope was withdrawn and the procedure completed. COMPLICATIONS: There were no complications.  ENDOSCOPIC IMPRESSION: 1.   Semi-pedunculated polyp measuring 1 cm in size was found in  the proximal sigmoid colon; polypectomy was performed using snare cautery 2.   Mild diverticulosis was noted in the ascending colon 3.   The colon mucosa was otherwise normal  RECOMMENDATIONS: If the polyp(s) removed today are proven to be adenomatous (pre-cancerous) polyps, you will need a repeat colonoscopy in 5 years.  Otherwise you should continue to follow colorectal cancer screening guidelines for "routine risk" patients with colonoscopy in 10 years.  You will receive a letter within 1-2 weeks with the results of your biopsy as well as final recommendations.  Please call my office if you have not received a letter after 3 weeks.   eSigned:  Louis Meckel, MD 05/22/2013 10:19 AM   cc:   PATIENT NAME:  Justin, Sellers MR#: [id number]

## 2013-05-22 NOTE — Progress Notes (Signed)
Called to room to assist during endoscopic procedure.  Patient ID and intended procedure confirmed with present staff. Received instructions for my participation in the procedure from the performing physician.  

## 2013-05-23 ENCOUNTER — Telehealth: Payer: Self-pay | Admitting: *Deleted

## 2013-05-23 NOTE — Telephone Encounter (Signed)
  Follow up Call-  Call back number 05/22/2013  Post procedure Call Back phone  # [phone]  Permission to leave phone message Yes     Patient questions:  Do you have a fever, pain , or abdominal swelling? no Pain Score  0 *  Have you tolerated food without any problems? yes  Have you been able to return to your normal activities? yes  Do you have any questions about your discharge instructions: Diet   no Medications  no Follow up visit  no  Do you have questions or concerns about your Care? no  Actions: * If pain score is 4 or above: No action needed, pain <4.

## 2013-05-29 ENCOUNTER — Encounter: Payer: Self-pay | Admitting: Gastroenterology

## 2013-10-23 ENCOUNTER — Ambulatory Visit: Payer: BC Managed Care – PPO | Admitting: Medical

## 2013-12-28 ENCOUNTER — Encounter: Payer: Self-pay | Admitting: Medical

## 2013-12-28 ENCOUNTER — Ambulatory Visit (INDEPENDENT_AMBULATORY_CARE_PROVIDER_SITE_OTHER): Payer: BC Managed Care – PPO | Admitting: Medical

## 2013-12-28 VITALS — BP 110/70 | HR 76 | Temp 97.7°F | Resp 16 | Wt 148.0 lb

## 2013-12-28 DIAGNOSIS — Z87891 Personal history of nicotine dependence: Secondary | ICD-10-CM

## 2013-12-28 DIAGNOSIS — R12 Heartburn: Secondary | ICD-10-CM

## 2013-12-28 DIAGNOSIS — R143 Flatulence: Secondary | ICD-10-CM

## 2013-12-28 DIAGNOSIS — R141 Gas pain: Secondary | ICD-10-CM

## 2013-12-28 DIAGNOSIS — Z8249 Family history of ischemic heart disease and other diseases of the circulatory system: Secondary | ICD-10-CM

## 2013-12-28 DIAGNOSIS — R142 Eructation: Secondary | ICD-10-CM

## 2013-12-28 DIAGNOSIS — R002 Palpitations: Secondary | ICD-10-CM

## 2013-12-28 DIAGNOSIS — R079 Chest pain, unspecified: Secondary | ICD-10-CM

## 2013-12-28 LAB — CBC
HEMATOCRIT: 42 % (ref 39.0–52.0)
Hemoglobin: 14.8 g/dL (ref 13.0–17.0)
MCH: 30 pg (ref 26.0–34.0)
MCHC: 35.2 g/dL (ref 30.0–36.0)
MCV: 85.2 fL (ref 78.0–100.0)
PLATELETS: 255 10*3/uL (ref 150–400)
RBC: 4.93 MIL/uL (ref 4.22–5.81)
RDW: 13.5 % (ref 11.5–15.5)
WBC: 5.6 10*3/uL (ref 4.0–10.5)

## 2013-12-28 LAB — COMPREHENSIVE METABOLIC PANEL
ALBUMIN: 4.4 g/dL (ref 3.5–5.2)
ALK PHOS: 81 U/L (ref 39–117)
ALT: 19 U/L (ref 0–53)
AST: 23 U/L (ref 0–37)
BUN: 15 mg/dL (ref 6–23)
CHLORIDE: 103 meq/L (ref 96–112)
CO2: 31 mEq/L (ref 19–32)
Calcium: 8.8 mg/dL (ref 8.4–10.5)
Creat: 0.81 mg/dL (ref 0.50–1.35)
Glucose, Bld: 74 mg/dL (ref 70–99)
POTASSIUM: 4 meq/L (ref 3.5–5.3)
SODIUM: 143 meq/L (ref 135–145)
Total Bilirubin: 0.5 mg/dL (ref 0.2–1.2)
Total Protein: 6.2 g/dL (ref 6.0–8.3)

## 2013-12-28 MED ORDER — ESOMEPRAZOLE MAGNESIUM 20 MG PO PACK: 20.0000 mg | PACK | Freq: Every day | ORAL | Status: DC

## 2013-12-28 NOTE — Patient Instructions (Signed)
  Thank you for giving me the opportunity to serve you today.    Your diagnosis today includes: Encounter Diagnoses  Name Primary?  . Chest pain Yes  . Palpitation   . Former smoker   . Family history of premature CAD   . Heartburn   . Flatulence      Specific recommendations today include:  Your symptoms today sound more related to acid reflux and indigestion although I can't rule out a cardiac source completely given your history of tobacco use and premature family history of heart disease in the family  Your EKG today was normal  Begin samples of Nexium 1 daily 30-45 minutes before breakfast  Please cut down on caffeine significantly  Increase water intake  Avoid spicy, greasy, acidic foods such as citrus and tomato-based foods  Let's recheck in 2 weeks, we will call with lab results  Return pending labs.

## 2013-12-28 NOTE — Progress Notes (Signed)
Subjective:   Justin Sellers is a 53 y.o. male presenting on 12/28/2013 with chest pain  Here for chest pain.  Lately having upper left chest x few weeks.  Been occurring daily.  Having heartburn, gas, belching on a regular basis as well.  Usually a light pain, sometimes feels trouble catching breath,, but this is not necessarily related to the chest pain.   Feels the pain when he starts working, but it goes away after a few minutes.  Not necessarily worse with activity.  Former smoker, none in 3-4 years.  Use to smoke 1ppd - 2ppd since age 53yo.  At times feels palpitations, racing of his heart.   Has also had some weakness in right arm from elbow down.  No other aggravating or relieving factors.  No other complaint.  Review of Systems ROS as in subjective      Objective:    Filed Vitals:   12/28/13 0952  BP: 110/70  Pulse: 76  Temp: 97.7 F (36.5 C)  Resp: 16    General appearance: alert, no distress, WD/WN, lean white male Oral cavity: MMM, no lesions Neck: supple, no lymphadenopathy, no thyromegaly, no masses Heart: RRR, normal S1, S2, no murmurs Lungs: CTA bilaterally, no wheezes, rhonchi, or rales Abdomen: +bs, soft, non tender, non distended, no masses, no hepatomegaly, no splenomegaly Pulses: 2+ symmetric, upper and lower extremities, normal cap refill Ext: no edema   Adult ECG Report  Indication: chest pain  Rate: 62bpm  Rhythm: normal sinus rhythm  QRS Axis: 71 degrees  PR Interval: 122ms  QRS Duration: 108ms  QTc: 337ms  Conduction Disturbances: none  Other Abnormalities: none  Patient's cardiac risk factors are: family history of premature cardiovascular disease, male gender and former smoker.  EKG comparison: 2014   Narrative Interpretation: unchanged         Assessment: Encounter Diagnoses  Name Primary?  . Chest pain Yes  . Palpitation   . Former smoker   . Family history of premature CAD   . Heartburn   . Flatulence      Plan: We  discussed his symptoms and concerns, and his symptoms are noncardiac other than palpitations which I believe is related to significant caffeine consumption. He drinks 2 pots of coffee a day plus soda throughout the day.  Specific recommendations today include:  Your symptoms today sound more related to acid reflux and indigestion although I can't rule out a cardiac source completely given your history of tobacco use and premature family history of heart disease in the family  Your EKG today was normal  Begin samples of Nexium 1 daily 30-45 minutes before breakfast  Please cut down on caffeine significantly  Increase water intake  Avoid spicy, greasy, acidic foods such as citrus and tomato-based foods  Let's recheck in 2 weeks, we will call with lab results    [name] was seen today for chest pain.  Diagnoses and associated orders for this visit:  Chest pain - CBC - Comprehensive metabolic panel - EKG 23-JSEG - PR ELECTROCARDIOGRAM, COMPLETE  Palpitation - CBC - Comprehensive metabolic panel - EKG 31-DVVO - PR ELECTROCARDIOGRAM, COMPLETE  Former smoker - CBC - Comprehensive metabolic panel - EKG 16-WVPX - PR ELECTROCARDIOGRAM, COMPLETE  Family history of premature CAD - CBC - Comprehensive metabolic panel - EKG 10-GYIR - PR ELECTROCARDIOGRAM, COMPLETE  Heartburn - CBC - Comprehensive metabolic panel - EKG 48-NIOE - PR ELECTROCARDIOGRAM, COMPLETE  Flatulence - CBC - Comprehensive metabolic panel - EKG  12-Lead - PR ELECTROCARDIOGRAM, COMPLETE    Return pending labs.

## 2014-01-02 ENCOUNTER — Encounter: Payer: Self-pay | Admitting: Family Medicine

## 2014-01-11 ENCOUNTER — Ambulatory Visit: Payer: BC Managed Care – PPO | Admitting: Medical

## 2014-01-17 ENCOUNTER — Encounter: Payer: Self-pay | Admitting: Family Medicine

## 2014-06-21 ENCOUNTER — Encounter: Payer: BC Managed Care – PPO | Admitting: Medical

## 2014-07-11 ENCOUNTER — Encounter: Payer: Self-pay | Admitting: Medical

## 2014-07-11 ENCOUNTER — Ambulatory Visit (INDEPENDENT_AMBULATORY_CARE_PROVIDER_SITE_OTHER): Payer: BC Managed Care – PPO | Admitting: Medical

## 2014-07-11 VITALS — BP 100/80 | HR 66 | Temp 98.6°F | Resp 16 | Ht 70.0 in | Wt 149.0 lb

## 2014-07-11 DIAGNOSIS — Z0000 Encounter for general adult medical examination without abnormal findings: Secondary | ICD-10-CM

## 2014-07-11 DIAGNOSIS — N528 Other male erectile dysfunction: Secondary | ICD-10-CM

## 2014-07-11 DIAGNOSIS — G479 Sleep disorder, unspecified: Secondary | ICD-10-CM

## 2014-07-11 DIAGNOSIS — R51 Headache: Secondary | ICD-10-CM

## 2014-07-11 DIAGNOSIS — G8929 Other chronic pain: Secondary | ICD-10-CM

## 2014-07-11 DIAGNOSIS — F1510 Other stimulant abuse, uncomplicated: Secondary | ICD-10-CM

## 2014-07-11 DIAGNOSIS — R002 Palpitations: Secondary | ICD-10-CM

## 2014-07-11 DIAGNOSIS — Z23 Encounter for immunization: Secondary | ICD-10-CM

## 2014-07-11 LAB — POCT URINALYSIS DIPSTICK
Bilirubin, UA: NEGATIVE
Glucose, UA: NEGATIVE
Ketones, UA: NEGATIVE
Leukocytes, UA: NEGATIVE
NITRITE UA: NEGATIVE
PH UA: 6
Protein, UA: NEGATIVE
RBC UA: NEGATIVE
Urobilinogen, UA: NEGATIVE

## 2014-07-11 NOTE — Progress Notes (Signed)
Subjective:   HPI  Justin Sellers is a 53 y.o. male who presents for a complete physical.   Preventative care: Last ophthalmology visit:yes- last eye exam was this year 2015 Last dental visit:n/a false teeth Last colonoscopy:9/ 2014 Last prostate exam: last year here Last EKG:12/28/2013 Last labs:2014  Prior vaccinations: TD or Tdap:<5 years Influenza:07/11/2014 Pneumococcal:n/a Shingles/Zostavax:n/a  Advanced directive:n/a Health care power of attorney:n/a Living will:n/a  Concerns: For the last several months having problems getting erections, keeping erections, but does still get some morning erections.  Still gets chronic headaches but not every week, usually improves with over-the-counter Excedrin  Continues to have palpitations, we discussed this back in May.  At times they are significant enough that he goes to the bathroom to relax  He drinks at least 1 L of soda daily and several cups of coffee every day to keep him awake with his late-night work shift.  He works an alternating schedule with his wife, when he gets off from work he it's only 1-2 hours of sleep and then has to get up and pick up his daughter from school, and then only gets an average of 3 hours of sleep per night given the break in his schedule  Reviewed their medical, surgical, family, social, medication, and allergy history and updated chart as appropriate.  Past Medical History  Diagnosis Date  . Wears glasses   . Full dentures   . Chronic headache   . Depression     10-12 years ago    Past Surgical History  Procedure Laterality Date  . Fracture surgery  1970    left arm  . Colonoscopy  05/22/13    Tubular adenoma, polyp, Dr. Deatra Ina; repeat in 5 years    History   Social History  . Marital Status: Married    Spouse Name: N/A    Number of Children: N/A  . Years of Education: N/A   Occupational History  . Not on file.   Social History Main Topics  . Smoking status: Former Smoker --  16.00 packs/day for 1.5 years    Types: Cigarettes    Quit date: 11/23/2010  . Smokeless tobacco: Never Used  . Alcohol Use: No  . Drug Use: No  . Sexual Activity: Not on file   Other Topics Concern  . Not on file   Social History Narrative   Married, 2 daughters, [place], walking for exercise regularly, 60miles at least, stocker for [employer]    Family History  Problem Relation Age of Onset  . Heart disease Mother 63    CABG  . Diabetes Mother   . Cancer Father     lung  . Hypertension Father   . Thyroid disease Sister   . Heart disease Paternal Grandmother   . Cancer Paternal Grandfather     lung  . Thyroid disease Sister   . Colon cancer Neg Hx     Current outpatient prescriptions: aspirin-acetaminophen-caffeine (EXCEDRIN MIGRAINE) 250-250-65 MG per tablet, Take 1 tablet by mouth every 6 (six) hours as needed for pain., Disp: , Rfl: ;  pseudoephedrine (SUDAFED) 120 MG 12 hr tablet, Take 120 mg by mouth every 12 (twelve) hours., Disp: , Rfl:   No Known Allergies    Review of Systems Constitutional: -fever, -chills, -sweats, -unexpected weight change, -decreased appetite, -fatigue Allergy: -sneezing, -itching, -congestion Dermatology: -changing moles, --rash, -lumps ENT: -runny nose, -ear pain, -sore throat, -hoarseness, -sinus pain, -teeth pain, - ringing in ears, -hearing loss, -nosebleeds Cardiology: -chest pain, -  palpitations, -swelling, -difficulty breathing when lying flat, -waking up short of breath Respiratory: -cough, -shortness of breath, -difficulty breathing with exercise or exertion, -wheezing, -coughing up blood Gastroenterology: -abdominal pain, -nausea, -vomiting, -diarrhea, -constipation, -blood in stool, -changes in bowel movement, -difficulty swallowing or eating Hematology: -bleeding, -bruising  Musculoskeletal: -joint aches, -muscle aches, -joint swelling, -back pain, -neck pain, -cramping, -changes in gait Ophthalmology: denies vision changes, eye  redness, itching, discharge Urology: -burning with urination, -difficulty urinating, -blood in urine, -urinary frequency, -urgency, -incontinence Neurology: -headache, -weakness, -tingling, -numbness, -memory loss, -falls, -dizziness Psychology: -depressed mood, -agitation, -sleep problems     Objective:   Physical Exam  BP 100/80 mmHg  Pulse 66  Temp(Src) 98.6 F (37 C) (Oral)  Resp 16  Ht 5\' 10"  (1.778 m)  Wt 149 lb (67.586 kg)  BMI 21.38 kg/m2  General appearance: alert, no distress, WD/WN, lean white male Skin: scattered benign appearing lesions, no worrisome lesions HEENT: normocephalic, conjunctiva/corneas normal, sclerae anicteric, PERRLA, EOMi, nares patent, right nare bordering septum with raised 55mm growth unchnaged for years per pt, flesh colored, no discharge or erythema, pharynx normal Oral cavity: MMM, tongue normal, full dentures Neck: supple, no lymphadenopathy, no thyromegaly, no masses, normal ROM, no bruits Chest: non tender, normal shape and expansion Heart: RRR, normal S1, S2, no murmurs Lungs: CTA bilaterally, no wheezes, rhonchi, or rales Abdomen: +bs, soft, non tender, non distended, no masses, no hepatomegaly, no splenomegaly, no bruits Back: non tender, normal ROM, no scoliosis Musculoskeletal: upper extremities non tender, no obvious deformity, normal ROM throughout, lower extremities non tender, no obvious deformity, normal ROM throughout Extremities: no edema, no cyanosis, no clubbing Pulses: 2+ symmetric, upper and lower extremities, normal cap refill Neurological: alert, oriented x 3, CN2-12 intact, strength normal upper extremities and lower extremities, sensation normal throughout, DTRs 2+ throughout, no cerebellar signs, gait normal Psychiatric: normal affect, behavior normal, pleasant  GU: normal male external genitalia, circumcised, nontender, no masses, no hernia, no lymphadenopathy Rectal: deferred  Assessment and Plan :    Encounter  Diagnoses  Name Primary?  . Encounter for health maintenance examination in adult Yes  . Other male erectile dysfunction   . Caffeine abuse   . Palpitations   . Chronic nonintractable headache, unspecified headache type   . Need for prophylactic vaccination and inoculation against influenza   . Sleep disturbance    Physical exam - discussed healthy lifestyle, diet, exercise, preventative care, vaccinations, and addressed their concerns.  I reviewed his lab results and notes from last years physical as well as his colonoscopy report from last year Counseled on the influenza virus vaccine.  Vaccine information sheet given.  Influenza vaccine given after consent obtained. See your eye doctor yearly for routine vision care. ED - likely due to a combination of factors including poor sleep, caffeine abuse. Check a testosterone level today Caffeine abuse - counseled on caffeine use Palpitations - most likely due to excessive caffeine use, advise he start cutting back caffeine gradually and get down to 1-2 drinks per day Chronic headaches - also likely due to excessive caffeine use and poor sleep, needs to work on both things Sleep disturbance - sleep schedule is not ideal due to him and his wife's alternating work schedule and the fact that he has to pick up his daughter within just a few hours of getting off of work once he has been asleep 1-2 hours.  he works night shift.  discussed trying to do better with sleep hygiene, getting at  least 5-6 hours of sleep in a row Follow-up pending lab

## 2014-07-12 LAB — TESTOSTERONE: Testosterone: 341 ng/dL (ref 300–890)

## 2014-09-12 ENCOUNTER — Telehealth: Payer: Self-pay | Admitting: Medical

## 2014-09-12 ENCOUNTER — Encounter: Payer: Self-pay | Admitting: Medical

## 2014-09-12 ENCOUNTER — Other Ambulatory Visit: Payer: Self-pay | Admitting: Family Medicine

## 2014-09-12 ENCOUNTER — Ambulatory Visit (INDEPENDENT_AMBULATORY_CARE_PROVIDER_SITE_OTHER): Payer: BLUE CROSS/BLUE SHIELD | Admitting: Medical

## 2014-09-12 VITALS — BP 100/80 | HR 80 | Temp 98.6°F | Resp 15 | Wt 156.0 lb

## 2014-09-12 DIAGNOSIS — R4189 Other symptoms and signs involving cognitive functions and awareness: Secondary | ICD-10-CM

## 2014-09-12 DIAGNOSIS — R51 Headache: Secondary | ICD-10-CM

## 2014-09-12 DIAGNOSIS — G8929 Other chronic pain: Secondary | ICD-10-CM

## 2014-09-12 DIAGNOSIS — R29898 Other symptoms and signs involving the musculoskeletal system: Secondary | ICD-10-CM

## 2014-09-12 DIAGNOSIS — G479 Sleep disorder, unspecified: Secondary | ICD-10-CM

## 2014-09-12 NOTE — Telephone Encounter (Signed)
Refer to [facility] for sleep issues, sleep disturbance, unusual behaviors.  Send OV note, last full labs in chart, MMSE

## 2014-09-12 NOTE — Progress Notes (Signed)
Subjective: Here for concerns parasomnia and parkinson's after reading on the web. He has a history of chronic headaches, history of sleep disturbance which we discussed that his last visit which seemed more likely due to erratic sleep pattern given he and wife schedule.  But recently lashing out in dreams.  He notes recent was fighting something in a dream, but was actually pushing down on wife's arm and breast in his sleep.  She ended up having to waking him up which was difficult.  He has had a few times where he had bad dreams, lashing out in this dreams.   He didn't realized this happened until she awoke him up.  He never hurt her in the past until this recent dream.  He says in general he is not a violent person.  This dream/sleep problem is very concerning to him.      Recently was pushing a buggy to the counter, and somehow walked through the cashier area and out the door, felt like he was in a trance, and didn't seem to snap out of it til the store automatic door opened up.    He works an alternating schedule with his wife, when he gets off from work he it's only 1-2 hours of sleep and then has to get up and pick up his daughter from school, and then only gets an average of 3 hours of sleep per night given the break in his schedule  Since last visit where we discussed headaches "migraines" and sleep problems, he had cut significantly back on caffeine and has had some improvement on headaches.   Review of Systems Constitutional: -fever, -chills, -sweats, -unexpected weight change, +fatigue ENT: -runny nose, -ear pain, -sore throat Cardiology:  -chest pain, -palpitations, -edema Respiratory: -cough, -shortness of breath, -wheezing Gastroenterology: -abdominal pain, -nausea, -vomiting, -diarrhea, -constipation, no stool incontinence Hematology: -bleeding or bruising problems Musculoskeletal: -arthralgias, -myalgias, -joint swelling, -back pain Ophthalmology: +at time sees spots with  headaches, otherwise -vision changes Urology: -dysuria, -difficulty urinating, -hematuria, +urinary frequency, -urgency, -incontinence Neurology: +headache, -ataxia, +sometimes arm weakness, -tingling, -numbness, no confusion, no tremor   Past Medical History  Diagnosis Date  . Wears glasses   . Full dentures   . Chronic headache   . Depression     10-12 years ago    Objective: BP 100/80 mmHg  Pulse 80  Temp(Src) 98.6 F (37 C) (Oral)  Resp 15  Wt 156 lb (70.761 kg)   General appearance: alert, no distress, WD/WN HEENT: normocephalic, sclerae anicteric, PERRLA, EOMi, nares patent, no discharge or erythema, pharynx normal Oral cavity: MMM, no lesions Neck: supple, no lymphadenopathy, no thyromegaly, no masses, no bruits Heart: RRR, normal S1, S2, no murmurs Lungs: CTA bilaterally, no wheezes, rhonchi, or rales Musculoskeletal: nontender, no swelling, no obvious deformity Extremities: no edema, no cyanosis, no clubbing Pulses: 2+ symmetric, upper and lower extremities, normal cap refill Neurological: alert, oriented x 3, CN2-12 intact, strength normal upper extremities and lower extremities, sensation normal throughout, DTRs 2+ throughout, no cerebellar signs, gait normal Psychiatric: normal affect, behavior normal, pleasant   MMSE 29/30   Assessment: Encounter Diagnoses  Name Primary?  . Sleep disturbance Yes  . Arm weakness   . Chronic nonintractable headache, unspecified headache type   . Complaint related to dreams      Plan: UA unremarkable, reviewed notes from his last physical and last labs.   At this point I suspect his unusual sleep scheduled plays the most important role, but referral to  neurology to rule out other causes of his symptoms.

## 2014-09-12 NOTE — Telephone Encounter (Signed)
[facility] referral orders are in [facility] they will contact patient for his appointment

## 2015-03-25 ENCOUNTER — Ambulatory Visit: Payer: BLUE CROSS/BLUE SHIELD | Admitting: Medical

## 2015-05-07 ENCOUNTER — Ambulatory Visit (INDEPENDENT_AMBULATORY_CARE_PROVIDER_SITE_OTHER): Payer: BLUE CROSS/BLUE SHIELD | Admitting: Medical

## 2015-05-07 VITALS — BP 108/80 | HR 74 | Temp 98.5°F | Wt 158.4 lb

## 2015-05-07 DIAGNOSIS — R202 Paresthesia of skin: Secondary | ICD-10-CM | POA: Diagnosis not present

## 2015-05-07 DIAGNOSIS — M722 Plantar fascial fibromatosis: Secondary | ICD-10-CM | POA: Diagnosis not present

## 2015-05-07 MED ORDER — NAPROXEN 500 MG PO TABS: 500.0000 mg | ORAL_TABLET | Freq: Two times a day (BID) | ORAL | Status: DC

## 2015-05-07 NOTE — Progress Notes (Signed)
   Subjective: Chief Complaint  Patient presents with  . Right foot pain    painful to walk/arch   He notes intermittent foot pain for months, but worse in the last week.   Pain is right in the middle bottom of the foot.   Worse working long periods on concrete floors.   At home barefoot makes it worse.   Improved with staying off the feet.   Just the right foot.   Denies trauma or injury, no numbness or tingling.   Just pain.   No ice.   Uses current shoes he's had for 27mo.     He notes arms seem to be going to sleep lately.   Worse when arms overhead lying in bed.  Both arms seem to go numb, whichever one is over his head.   He is on the computer a lot.  No other aggravating or relieving factors. No other complaint.  Past Medical History  Diagnosis Date  . Wears glasses   . Full dentures   . Chronic headache   . Depression     10-12 years ago   Past Surgical History  Procedure Laterality Date  . Fracture surgery  1970    left arm  . Colonoscopy  05/22/13    Tubular adenoma, polyp, Dr. Deatra Ina; repeat in 5 years   ROS as in subjective   Objective: BP 108/80 mmHg  Pulse 74  Temp(Src) 98.5 F (36.9 C)  Wt 158 lb 6.4 oz (71.85 kg)  Gen: wd, wn, nad Skin: unremarkable of feet and arms Neck supple, non tender, normal ROM, no mass thyromegaly or lymphadenopathy Arms non tender no deformity, no atrophy Right plantar fascia tender, otherwise non tender, relatively good arches, no deformity, normal foot ankle and toe ROM Pulses normal UE and LE Ext: no edema Neuro: mildly +phalens on the left arm, otherwise arms and legs with normal strength, sensation and DTRs.    Assessment: Encounter Diagnoses  Name Primary?  . Plantar fasciitis of right foot Yes  . Paresthesia of left arm     Plan: Plantar fascitis - begin daily routine of towel stretches, tennis ball massage, ice water bottle massage, begin Naprosyn BID, avoid going barefoot, change to better supportive shoes.    May take 1-2 months to see improvement.   If not improving consider foot splints.     paresthesias - possibly radicular etiology,but could just be arm positions with sleep and computer use.   discussed avoiding pressure on ulnar nerve, avoiding sleeping with pressure on arms, or avoid sleeping with arms above head.   discussed and demonstrated better position.  If worsening or not improving then recheck.

## 2015-05-07 NOTE — Patient Instructions (Signed)
Thank you for giving me the opportunity to serve you today.    Your diagnosis today includes: Encounter Diagnoses  Name Primary?  . Plantar fasciitis of right foot Yes  . Paresthesia of left arm     Recommendations:  Begin Naprosyn twice daily for the next few weeks for pain and inflammation (with food)  Use the daily towel stretch and tennis ball massage for the feet  Use ice water bottle massage in the evenings   Wear supportive shoes at all times except bed  Avoid arm positioning that puts pressure on the arm particular at the computer or when sleeping  If not much improved in 1 months, then recheck   Plantar Fasciitis Plantar fasciitis is a common condition that causes foot pain. It is soreness (inflammation) of the band of tough fibrous tissue on the bottom of the foot that runs from the heel bone (calcaneus) to the ball of the foot. The cause of this soreness may be from excessive standing, poor fitting shoes, running on hard surfaces, being overweight, having an abnormal walk, or overuse (this is common in runners) of the painful foot or feet. It is also common in aerobic exercise dancers and ballet dancers. SYMPTOMS  Most people with plantar fasciitis complain of:  Severe pain in the morning on the bottom of their foot especially when taking the first steps out of bed. This pain recedes after a few minutes of walking.  Severe pain is experienced also during walking following a long period of inactivity.  Pain is worse when walking barefoot or up stairs DIAGNOSIS   Your caregiver will diagnose this condition by examining and feeling your foot.  Special tests such as X-rays of your foot, are usually not needed. PREVENTION   Consult a sports medicine professional before beginning a new exercise program.  Walking programs offer a good workout. With walking there is a lower chance of overuse injuries common to runners. There is less impact and less jarring of the  joints.  Begin all new exercise programs slowly. If problems or pain develop, decrease the amount of time or distance until you are at a comfortable level.  Wear good shoes and replace them regularly.  Stretch your foot and the heel cords at the back of the ankle (Achilles tendon) both before and after exercise.  Run or exercise on even surfaces that are not hard. For example, asphalt is better than pavement.  Do not run barefoot on hard surfaces.  If using a treadmill, vary the incline.  Do not continue to workout if you have foot or joint problems. Seek professional help if they do not improve. HOME CARE INSTRUCTIONS   Avoid activities that cause you pain until you recover.  Use ice or cold packs on the problem or painful areas after working out.  Only take over-the-counter or prescription medicines for pain, discomfort, or fever as directed by your caregiver.  Soft shoe inserts or athletic shoes with air or gel sole cushions may be helpful.  If problems continue or become more severe, consult a sports medicine caregiver or your own health care provider. Cortisone is a potent anti-inflammatory medication that may be injected into the painful area. You can discuss this treatment with your caregiver. MAKE SURE YOU:   Understand these instructions.  Will watch your condition.  Will get help right away if you are not doing well or get worse. Document Released: 05/05/2001 Document Revised: 11/02/2011 Document Reviewed: 07/04/2008 [facility] Patient Information 2015 [place], [place].  This information is not intended to replace advice given to you by your health care provider. Make sure you discuss any questions you have with your health care provider.     Carpal Tunnel Syndrome The carpal tunnel is a narrow area located on the palm side of your wrist. The tunnel is formed by the wrist bones and ligaments. Nerves, blood vessels, and tendons pass through the carpal tunnel. Repeated  wrist motion or certain diseases may cause swelling within the tunnel. This swelling pinches the main nerve in the wrist (median nerve) and causes the painful hand and arm condition called carpal tunnel syndrome. CAUSES   Repeated wrist motions.  Wrist injuries.  Certain diseases like arthritis, diabetes, alcoholism, hyperthyroidism, and kidney failure.  Obesity.  Pregnancy. SYMPTOMS   A "pins and needles" feeling in your fingers or hand, especially in your thumb, index and middle fingers.  Tingling or numbness in your fingers or hand.  An aching feeling in your entire arm, especially when your wrist and elbow are bent for long periods of time.  Wrist pain that goes up your arm to your shoulder.  Pain that goes down into your palm or fingers.  A weak feeling in your hands. DIAGNOSIS  Your health care provider will take your history and perform a physical exam. An electromyography test may be needed. This test measures electrical signals sent out by your nerves into the muscles. The electrical signals are usually slowed by carpal tunnel syndrome. You may also need X-rays. TREATMENT  Carpal tunnel syndrome may clear up by itself. Your health care provider may recommend a wrist splint or medicine such as a nonsteroidal anti-inflammatory medicine. Cortisone injections may help. Sometimes, surgery may be needed to free the pinched nerve.  HOME CARE INSTRUCTIONS   Take all medicine as directed by your health care provider. Only take over-the-counter or prescription medicines for pain, discomfort, or fever as directed by your health care provider.  If you were given a splint to keep your wrist from bending, wear it as directed. It is important to wear the splint at night. Wear the splint for as long as you have pain or numbness in your hand, arm, or wrist. This may take 1 to 2 months.  Rest your wrist from any activity that may be causing your pain. If your symptoms are work-related, you  may need to talk to your employer about changing to a job that does not require using your wrist.  Put ice on your wrist after long periods of wrist activity.  Put ice in a plastic bag.  Place a towel between your skin and the bag.  Leave the ice on for 15-20 minutes, 03-04 times a day.  Keep all follow-up visits as directed by your health care provider. This includes any orthopedic referrals, physical therapy, and rehabilitation. Any delay in getting necessary care could result in a delay or failure of your condition to heal. SEEK IMMEDIATE MEDICAL CARE IF:   You have new, unexplained symptoms.  Your symptoms get worse and are not helped or controlled with medicines. MAKE SURE YOU:   Understand these instructions.  Will watch your condition.  Will get help right away if you are not doing well or get worse. Document Released: 08/07/2000 Document Revised: 12/25/2013 Document Reviewed: 06/26/2011 [facility] Patient Information 2015 [place], [place]. This information is not intended to replace advice given to you by your health care provider. Make sure you discuss any questions you have with your health care  provider.

## 2015-06-11 ENCOUNTER — Ambulatory Visit (INDEPENDENT_AMBULATORY_CARE_PROVIDER_SITE_OTHER): Payer: BLUE CROSS/BLUE SHIELD | Admitting: Medical

## 2015-06-11 ENCOUNTER — Encounter: Payer: Self-pay | Admitting: Medical

## 2015-06-11 VITALS — BP 100/60 | HR 56 | Temp 98.1°F | Wt 159.0 lb

## 2015-06-11 DIAGNOSIS — M545 Low back pain, unspecified: Secondary | ICD-10-CM | POA: Insufficient documentation

## 2015-06-11 DIAGNOSIS — Z23 Encounter for immunization: Secondary | ICD-10-CM

## 2015-06-11 DIAGNOSIS — M722 Plantar fascial fibromatosis: Secondary | ICD-10-CM | POA: Diagnosis not present

## 2015-06-11 NOTE — Addendum Note (Signed)
Addended by: Minette Headland A on: 06/11/2015 11:00 AM   Modules accepted: Orders

## 2015-06-11 NOTE — Progress Notes (Signed)
   Subjective: Chief Complaint  Patient presents with  . Follow-up    recheck his foot. wants to talk about a lower back problem if you have time.    Here for recheck on plantar fascitis.  I saw him 05/07/15 for this.  Since last visit doing ok, seems to not having much problems with his feet currently.   He did end up changing shoes.    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  Every now and then gets some reoccurring back pain.   Most of last week had some low back pain, near the buttock.  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  Sometimes will get pains down the legs.  Denies any specific recent injury or trauma, no work injury of recent.   Sometimes will end up on his knees with pain.  Pain is intermittent, not daily.   Denies leg numbness.  Last imaging of back was years ago.  Denies urinary issues, no burning, no frequency, no urgency, no urinary stream changes, no blood in urine, no fever, no saddle anesthesia.  No other aggravating or relieving factors. No other complaint.  Past Medical History  Diagnosis Date  . Wears glasses   . Full dentures   . Chronic headache   . Depression     10-12 years ago   Past Surgical History  Procedure Laterality Date  . Fracture surgery  1970    left arm  . Colonoscopy  05/22/13    Tubular adenoma, polyp, Dr. Deatra Ina; repeat in 5 years   ROS as in subjective   Objective: BP 100/60 mmHg  Pulse 56  Temp(Src) 98.1 F (36.7 C)  Wt 159 lb (72.122 kg)  Gen: wd, wn, nad Chest non tender Back mildly tender low lumbar region.   Not particularly tender with ROM which is relatively full, no scoliosis Legs non tender, normal hip ROM, no deformity Neuro: normal LE strength, sensation, DTRs Pulses normal    Assessment: Encounter Diagnoses  Name Primary?  . Midline low back pain without sciatica Yes  . Plantar fasciitis   . Need  for prophylactic vaccination and inoculation against influenza     Plan: Low back pain - suspect arthritis/degenerative changes.  Discussed differential, common etiologies.  Will send for xray.   discussed daily stretching routine, core strengthening exercises as demonstrated, discussed NSAID OTC prn.  Of note, he is not one to take medications daily/regularly.  Plantar fascitis - much improved, f/u prn.    Counseled on the influenza virus vaccine.  Vaccine information sheet given.  Influenza vaccine given after consent obtained.

## 2015-09-27 ENCOUNTER — Ambulatory Visit (INDEPENDENT_AMBULATORY_CARE_PROVIDER_SITE_OTHER): Payer: BLUE CROSS/BLUE SHIELD | Admitting: Medical

## 2015-09-27 ENCOUNTER — Encounter: Payer: Self-pay | Admitting: Medical

## 2015-09-27 ENCOUNTER — Other Ambulatory Visit: Payer: Self-pay | Admitting: Medical

## 2015-09-27 ENCOUNTER — Ambulatory Visit
Admission: RE | Admit: 2015-09-27 | Discharge: 2015-09-27 | Disposition: A | Discharge status: Home / Self Care | Payer: BLUE CROSS/BLUE SHIELD | Source: Ambulatory Visit | Attending: Medical | Admitting: Medical

## 2015-09-27 VITALS — BP 118/78 | HR 68 | Temp 98.5°F | Resp 16 | Wt 162.6 lb

## 2015-09-27 DIAGNOSIS — R6883 Chills (without fever): Secondary | ICD-10-CM

## 2015-09-27 DIAGNOSIS — R059 Cough, unspecified: Secondary | ICD-10-CM

## 2015-09-27 DIAGNOSIS — R05 Cough: Secondary | ICD-10-CM | POA: Diagnosis not present

## 2015-09-27 DIAGNOSIS — R6889 Other general symptoms and signs: Secondary | ICD-10-CM

## 2015-09-27 DIAGNOSIS — R52 Pain, unspecified: Secondary | ICD-10-CM

## 2015-09-27 LAB — POC INFLUENZA A&B (BINAX/QUICKVUE)
Influenza A, POC: NEGATIVE
Influenza B, POC: NEGATIVE

## 2015-09-27 MED ORDER — ALBUTEROL SULFATE HFA 108 (90 BASE) MCG/ACT IN AERS
2.0000 | INHALATION_SPRAY | Freq: Four times a day (QID) | RESPIRATORY_TRACT | Status: DC | PRN
Start: 2015-09-27 — End: 2016-02-26

## 2015-09-27 NOTE — Progress Notes (Signed)
  Subjective:  Justin Sellers is a 55 y.o. male who presents for illness.  Started 2 days ago with headache, stuffy, bad cough with burning sensation in chest.  Using ibuprofen.  Using Nyquil, first time in years he has felt this miserable, body aches, chills.  Doesn't think he has fever.   Has sore throat, throat hurts worse with cough.   Has some diarrhea, but no NV.  No SOB or wheezing.   No sick contacts.   No other aggravating or relieving factors.  No other c/o.  Past Medical History  Diagnosis Date  . Wears glasses   . Full dentures   . Chronic headache   . Depression     10-12 years ago    ROS as in subjective   Objective: BP 118/78 mmHg  Pulse 68  Temp(Src) 98.5 F (36.9 C) (Oral)  Resp 16  Wt 162 lb 9.6 oz (73.755 kg)  Wt Readings from Last 3 Encounters:  09/27/15 162 lb 9.6 oz (73.755 kg)  06/11/15 159 lb (72.122 kg)  05/07/15 158 lb 6.4 oz (71.85 kg)    General appearance: Alert, WD/WN, no distress, ill appearing                             Skin: warm, no rash                           Head: no sinus tenderness,                            Eyes: conjunctiva normal, corneas clear, PERRLA                            Ears: pearly TMs, external ear canals normal                          Nose: septum midline, turbinates swollen, with erythema and clear discharge             Mouth/throat: MMM, tongue normal, mild pharyngeal erythema                           Neck: supple, no adenopathy, no thyromegaly, non tender                          Heart: RRR, normal S1, S2, no murmurs                         Lungs: somewhat decreased lung sounds left upper, few rhonchi, otherwise no wheezes or rales      Assessment  Encounter Diagnoses  Name Primary?  . Cough Yes  . Body aches   . Chills   . Flu-like symptoms       Plan: discussed symptoms.   Flu swab negative.  Will send for CXR given rapid onset of symptoms.  Advised rest, hydration, and we will call with xray results.   If xray negative, suspect flu despite -flu swab.

## 2015-10-01 ENCOUNTER — Telehealth: Payer: Self-pay | Admitting: Medical

## 2015-10-01 NOTE — Telephone Encounter (Signed)
I can not find a phone number for them, is a number listed on forms

## 2015-10-01 NOTE — Telephone Encounter (Signed)
Ok,then verify which 3 days he was out, I have to have specific dates

## 2015-10-01 NOTE — Telephone Encounter (Signed)
The office stated that if he has to miss 3 days or more they have to file these forms. It looks like we told him on the 3rd to stay home until Monday. If he was scheduled over the weekend that would count as his three days.

## 2015-10-01 NOTE — Telephone Encounter (Signed)
pls call [employer].  I got an FMLA inappropriately.   Mr. Justin Sellers was out recently for acute illness.   This would not warrant an FMLA.    We can or did provide an office note attesting to the fact he was here, out sick, but this wouldn't warrant FMLA forms.

## 2015-10-01 NOTE — Telephone Encounter (Signed)
Should be, check the forms.  If needed, give these to Mickel Baas to handle

## 2015-10-01 NOTE — Telephone Encounter (Signed)
LMTCB

## 2015-10-02 NOTE — Telephone Encounter (Signed)
09/25/15 he worked 10-2 he was supposed to work all night. 2/2 and 2/3 he did not work. That is the only days he missed

## 2015-10-03 ENCOUNTER — Telehealth: Payer: Self-pay | Admitting: Medical

## 2015-10-03 DIAGNOSIS — Z0279 Encounter for issue of other medical certificate: Secondary | ICD-10-CM

## 2015-10-03 NOTE — Telephone Encounter (Signed)
Called pt to inform that form has been completed. He will be coming in this morning to pay the $25 form completion fee so that we can fax the form to [facility]

## 2015-10-03 NOTE — Telephone Encounter (Signed)
FMLA paperwork has been completed (although in my opinion this was an inappropriate use of FMLA when a simple doctor note should have worked fine - this wasn't the patient's fault, but HR's inappropriate use )  Please SCAN copy for [place].  Return form to patient or employer as requested.    Charge the customary fee for FMLA form completion.

## 2016-01-15 ENCOUNTER — Encounter: Payer: Self-pay | Admitting: Medical

## 2016-01-15 ENCOUNTER — Ambulatory Visit (INDEPENDENT_AMBULATORY_CARE_PROVIDER_SITE_OTHER): Payer: BLUE CROSS/BLUE SHIELD | Admitting: Medical

## 2016-01-15 VITALS — BP 128/86 | HR 78 | Temp 97.2°F | Wt 163.0 lb

## 2016-01-15 DIAGNOSIS — R143 Flatulence: Secondary | ICD-10-CM | POA: Diagnosis not present

## 2016-01-15 DIAGNOSIS — R0981 Nasal congestion: Secondary | ICD-10-CM | POA: Diagnosis not present

## 2016-01-15 DIAGNOSIS — J309 Allergic rhinitis, unspecified: Secondary | ICD-10-CM | POA: Diagnosis not present

## 2016-01-15 DIAGNOSIS — R142 Eructation: Secondary | ICD-10-CM | POA: Diagnosis not present

## 2016-01-15 DIAGNOSIS — R112 Nausea with vomiting, unspecified: Secondary | ICD-10-CM

## 2016-01-15 MED ORDER — BUDESONIDE 32 MCG/ACT NA SUSP: 1.0000 | Freq: Every day | NASAL | Status: DC

## 2016-01-15 MED ORDER — CETIRIZINE HCL 10 MG PO TABS: 10.0000 mg | ORAL_TABLET | Freq: Every day | ORAL | Status: DC

## 2016-01-15 NOTE — Progress Notes (Signed)
Subjective: Chief Complaint  Patient presents with  . Cough    states it is going away. is having bad gas. thinks it is his sinuses draining downward and is cousing the problem because food or drink doesnt change it. sometimes calls him to throw up, states it will have a light red blood in it. has taken meds and they always stop working   Here mainly for gas.   Has eased up on coffee and soda, but everything seems to be a problem.  No matter what he eats or drinkg ends up with gas.    Has sinus drainage, draining into stomach.  Using OTC gas medication, helps some.   Spits up some, vomits some, and seems like food comes back up, can't seem to get rid of gas.  Its a gnawing aggravation.   When he takes sudafed for sinuses the gas eases up.   Doesn't eat much onions or beans.  Takes some ibuporfen regularly for headaches, but has backed off this.  After eating, a short time later after the food has settleed, it will come back up.   Tried some sodium bicarb in water OTC.  That came right back up.  Not particularly stressed.  Eating a lot of nutri gain bars with a lot of fiber.   No other aggravating or relieving factors. No other complaint.   Past Medical History  Diagnosis Date  . Wears glasses   . Full dentures   . Chronic headache   . Depression     10-12 years ago   Past Surgical History  Procedure Laterality Date  . Fracture surgery  1970    left arm  . Colonoscopy  05/22/13    Tubular adenoma, polyp, Justin Sellers; repeat in 5 years    ROS as in subjective   Objective: BP 128/86 mmHg  Pulse 78  Temp(Src) 97.2 F (36.2 C) (Tympanic)  Wt 163 lb (73.936 kg)  General appearence: alert, no distress, WD/WN,  HEENT: normocephalic, sclerae anicteric, TMs pearly, nares patent, no discharge or erythema, pharynx normal Oral cavity: MMM, no lesions Neck: supple, no lymphadenopathy, no thyromegaly, no masses Heart: RRR, normal S1, S2, no murmurs Lungs: CTA bilaterally, no wheezes, rhonchi,  or rales Abdomen: +bs, soft, non tender, non distended, no masses, no hepatomegaly, no splenomegaly Pulses: 2+ symmetric, upper and lower extremities, normal cap refill Ext: no edema     Assessment: Encounter Diagnoses  Name Primary?  . Allergic rhinitis, unspecified allergic rhinitis type Yes  . Sinus congestion   . Nausea and vomiting, intractability of vomiting not specified, unspecified vomiting type   . Belching   . Flatulence symptom      Plan: Begin trial of zyrtec and rhinocaort.   Gave samples today.  Tired to explain that one can develop new allergies to allergens over time.   Will check allergy panel.      Refer back to GI for the belching, nasuea, vomting he has having this is quite aggravingt to him.   He has seen Justin Sellers prior for colnopscy.    Avoid gas causing foods.    Justin Sellers was seen today for cough.  Diagnoses and all orders for this visit:  Allergic rhinitis, unspecified allergic rhinitis type -     [facility]([place]) -     Allergy Panel, Animal [facility] -     Allergy-Shellfish Panel  Sinus congestion -     [facility](DC,[place]) -     Allergy Panel,  Animal Group -     Allergy-Shellfish Panel  Nausea and vomiting, intractability of vomiting not specified, unspecified vomiting type -     [facility](DC,[place]) -     Allergy-Shellfish Panel  Belching -     [facility](DC,[place]) -     Allergy-Shellfish Panel  Flatulence symptom -     [facility](DC,[place]) -     Allergy-Shellfish Panel

## 2016-01-16 LAB — RESPIRATORY ALLERGY PROFILE REGION II ~~LOC~~
Allergen, Cedar tree, t12: <0.1 kU/L
Allergen, D pternoyssinus,d7: <0.1 kU/L
Allergen, Mouse Urine Protein, e78: <0.1 kU/L
Allergen, Oak,t7: <0.1 kU/L
Box Elder IgE: <0.1 kU/L
Cat Dander: <0.1 kU/L
D. farinae: <0.1 kU/L
Dog Dander: <0.1 kU/L
Elm IgE: <0.1 kU/L
IgE (Immunoglobulin E), Serum: 21 kU/L (ref <115)
Johnson Grass: <0.1 kU/L
Sheep Sorrel IgE: <0.1 kU/L

## 2016-01-16 LAB — MIDATLANTIC REGIONAL ALLERGY PANEL (DC,DE,MD,~~LOC~~,VA,WV)
Allergen, Oak,t7: <0.1 kU/L
Allergen, Walnut,t10: <0.1 kU/L
Alternaria Alternata: <0.1 kU/L
Bermuda Grass: <0.1 kU/L
Common Ragweed: <0.1 kU/L
Elm IgE: <0.1 kU/L
Johnson Grass: <0.1 kU/L

## 2016-01-16 LAB — ALLERGY EVALUATION 2, SOUTHEAST
Cat Dander: <0.1 kU/L
Cladosporium Herbarum: <0.1 kU/L
D. farinae: <0.1 kU/L
Dog Dander: <0.1 kU/L
Rough Pigweed  IgE: <0.1 kU/L

## 2016-01-17 ENCOUNTER — Encounter: Payer: Self-pay | Admitting: Medical

## 2016-01-17 DIAGNOSIS — R142 Eructation: Secondary | ICD-10-CM

## 2016-01-17 DIAGNOSIS — R112 Nausea with vomiting, unspecified: Secondary | ICD-10-CM

## 2016-01-17 DIAGNOSIS — R143 Flatulence: Secondary | ICD-10-CM

## 2016-01-21 ENCOUNTER — Other Ambulatory Visit: Payer: Self-pay | Admitting: Medical

## 2016-01-21 MED ORDER — FLUTICASONE PROPIONATE 50 MCG/ACT NA SUSP: 1.0000 | Freq: Every day | NASAL | Status: DC

## 2016-01-21 NOTE — Telephone Encounter (Signed)
Pt stated that his Rhinocort can no longer be filled the pharmacy doesn't carry it anymore

## 2016-02-03 ENCOUNTER — Telehealth: Payer: Self-pay | Admitting: Medical

## 2016-02-03 ENCOUNTER — Other Ambulatory Visit: Payer: Self-pay | Admitting: Medical

## 2016-02-03 MED ORDER — ESOMEPRAZOLE MAGNESIUM 20 MG PO PACK: 20.0000 mg | PACK | Freq: Every day | ORAL | Status: DC

## 2016-02-03 NOTE — Telephone Encounter (Signed)
Not sure if he heard back from [place] as I can't see notes regarding call back, but the allergy labs were all normal.    Thus, he may have something called vasomotor rhinitis not related to allergies.   Is he using the nasal spray and is it working?  If not, then I'll change to different nasal spray but recommend referral to ENT as well.   If his medication is helping, then fine and lets stick with this.

## 2016-02-03 NOTE — Telephone Encounter (Signed)
Pt is aware and has [facility] appt next month

## 2016-02-03 NOTE — Telephone Encounter (Signed)
States that the Nasonex and Rhinocort help & is using them together, but his saliva is fizzy. Still having trouble with the gas.

## 2016-02-03 NOTE — Telephone Encounter (Signed)
He should use either Rhinocort or Nasacort but not both simultaneously.  If this is helping then fine.  If not, I can change him to Ipratropium nasal for vasomotor rhinitis.   Regarding gas, have him do trial of Nexium daily.   If too expensive let me know.   Can use Tums and gas x prn as well (OTC)  If not improving or if gas issue continues, we can pursue GI consult

## 2016-02-26 ENCOUNTER — Other Ambulatory Visit (INDEPENDENT_AMBULATORY_CARE_PROVIDER_SITE_OTHER): Payer: BLUE CROSS/BLUE SHIELD

## 2016-02-26 ENCOUNTER — Encounter: Payer: Self-pay | Admitting: Gastroenterology

## 2016-02-26 ENCOUNTER — Ambulatory Visit (INDEPENDENT_AMBULATORY_CARE_PROVIDER_SITE_OTHER): Payer: BLUE CROSS/BLUE SHIELD | Admitting: Gastroenterology

## 2016-02-26 VITALS — BP 96/68 | HR 72 | Ht 69.75 in | Wt 162.1 lb

## 2016-02-26 DIAGNOSIS — R1013 Epigastric pain: Secondary | ICD-10-CM

## 2016-02-26 DIAGNOSIS — R140 Abdominal distension (gaseous): Secondary | ICD-10-CM

## 2016-02-26 DIAGNOSIS — K219 Gastro-esophageal reflux disease without esophagitis: Secondary | ICD-10-CM

## 2016-02-26 LAB — H. PYLORI ANTIBODY, IGG: H Pylori IgG: NEGATIVE

## 2016-02-26 NOTE — Patient Instructions (Signed)
Your physician has requested that you go to the basement for the following lab work before leaving today: H. Pylori  Thank you for choosing [facility]  Dr Wilfrid Lund III

## 2016-02-26 NOTE — Progress Notes (Signed)
[facility] Progress Note  Chief Complaint: Upper abdominal pain and bloating  Subjective History:  This is a 55 year old man is referred back to see [place] for a constellation of GI symptoms. He was last seen by Dr. Deatra Ina for routine colonoscopy incident ever 2014, 1 polyp was removed. For the last several months he has had frequent postprandial upper abdominal bloating feelings of gas belching pyrosis and intermittent "spitting up" of fluid. He thought it might have some relation to his chronic sinus drainage or meds he was taking for that. He has lately been on Nexium that seems to be helping the symptoms, but he is concerned that he does not know the underlying cause and if he should really continue the Nexium. He has no clear or consistent food triggers, he denies dysphagia, odynophagia, early satiety or weight loss. There may been some improvement in symptoms cutting back his caffeine. He last saw his primary care on 01/15/2016, that note was reviewed.  ROS: Cardiovascular:  no chest pain Respiratory: no dyspnea  The patient's Past Medical, Family and Social History were reviewed and are on file in the EMR.  Objective:  Med list reviewed  Vital signs in last 24 hrs: Filed Vitals:   02/26/16 1055  BP: 96/68  Pulse: 72    Physical Exam   HEENT: sclera anicteric, oral mucosa moist without lesions  Neck: supple, no thyromegaly, JVD or lymphadenopathy  Cardiac: RRR without murmurs, S1S2 heard, no peripheral edema  Pulm: clear to auscultation bilaterally, normal RR and effort noted  Abdomen: soft, No tenderness, with active bowel sounds. No guarding or palpable hepatosplenomegaly.  Skin; warm and dry, no jaundice or rash  Recent Labs:  No recent labs or imaging     @ASSESSMENTPLANBEGIN @ Assessment: Encounter Diagnoses  Name Primary?  . Dyspepsia Yes  . Abdominal bloating   . Gastroesophageal reflux disease, esophagitis presence not specified   The symptoms are  somewhat difficult to characterize. There are no red flag symptoms, but I recommended an upper endoscopy to rule out gastritis, H. pylori, hiatal hernia, obstruction, or neoplasia. He declined at this point, and instead opted to check H. pylori serum antibody, try to wean off his Nexium and call me if he continues to have trouble.    Nelida Meuse III

## 2016-02-27 NOTE — Patient Instructions (Signed)
Pt notified and aware of lab results 

## 2016-03-31 ENCOUNTER — Ambulatory Visit
Admission: RE | Admit: 2016-03-31 | Discharge: 2016-03-31 | Disposition: A | Discharge status: Home / Self Care | Payer: BLUE CROSS/BLUE SHIELD | Source: Ambulatory Visit | Attending: Family Medicine | Admitting: Family Medicine

## 2016-03-31 ENCOUNTER — Telehealth: Payer: Self-pay

## 2016-03-31 ENCOUNTER — Encounter: Payer: Self-pay | Admitting: Family Medicine

## 2016-03-31 ENCOUNTER — Ambulatory Visit (INDEPENDENT_AMBULATORY_CARE_PROVIDER_SITE_OTHER): Payer: BLUE CROSS/BLUE SHIELD | Admitting: Family Medicine

## 2016-03-31 VITALS — BP 110/60 | HR 76 | Temp 98.9°F | Wt 160.0 lb

## 2016-03-31 DIAGNOSIS — R509 Fever, unspecified: Secondary | ICD-10-CM | POA: Diagnosis not present

## 2016-03-31 LAB — COMPREHENSIVE METABOLIC PANEL
ALBUMIN: 4.1 g/dL (ref 3.6–5.1)
ALK PHOS: 102 U/L (ref 40–115)
ALT: 60 U/L — AB (ref 9–46)
AST: 51 U/L — ABNORMAL HIGH (ref 10–35)
BILIRUBIN TOTAL: 0.7 mg/dL (ref 0.2–1.2)
BUN: 15 mg/dL (ref 7–25)
CHLORIDE: 103 mmol/L (ref 98–110)
CO2: 24 mmol/L (ref 20–31)
CREATININE: 0.9 mg/dL (ref 0.70–1.33)
Calcium: 8.7 mg/dL (ref 8.6–10.3)
Glucose, Bld: 111 mg/dL — ABNORMAL HIGH (ref 65–99)
Potassium: 4.1 mmol/L (ref 3.5–5.3)
SODIUM: 135 mmol/L (ref 135–146)
TOTAL PROTEIN: 6.3 g/dL (ref 6.1–8.1)

## 2016-03-31 LAB — CBC WITH DIFFERENTIAL/PLATELET
BASOS ABS: 0 {cells}/uL (ref 0–200)
BASOS PCT: 0 %
EOS PCT: 0 %
Eosinophils Absolute: 0 cells/uL — ABNORMAL LOW (ref 15–500)
HCT: 44.3 % (ref 38.5–50.0)
HEMOGLOBIN: 15.6 g/dL (ref 13.2–17.1)
Lymphocytes Relative: 29 %
Lymphs Abs: 986 cells/uL (ref 850–3900)
MCH: 30.6 pg (ref 27.0–33.0)
MCHC: 35.2 g/dL (ref 32.0–36.0)
MCV: 86.9 fL (ref 80.0–100.0)
MPV: 9.8 fL (ref 7.5–12.5)
Monocytes Absolute: 374 cells/uL (ref 200–950)
Monocytes Relative: 11 %
NEUTROS ABS: 2040 {cells}/uL (ref 1500–7800)
Neutrophils Relative %: 60 %
Platelets: 190 10*3/uL (ref 140–400)
RBC: 5.1 MIL/uL (ref 4.20–5.80)
RDW: 12.5 % (ref 11.0–15.0)
WBC: 3.4 10*3/uL — ABNORMAL LOW (ref 4.0–10.5)

## 2016-03-31 NOTE — Progress Notes (Signed)
   Subjective:    Patient ID: Justin Sellers, male    DOB: [birth year], 55 y.o.   MRN: [id number]  HPI He complains of a three-day history of started with a fever as high as 103 with myalgias, malaise but no cough, congestion, sore throat, shortness of breath. He did take 2 ibuprofen without much benefit. 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   Review of Systems     Objective:   Physical Exam Alert and in no distress. Tympanic membranes and canals are normal. Pharyngeal area is normal. Neck is supple without adenopathy or thyromegaly. Cardiac exam shows a regular sinus rhythm without murmurs or gallops. Lungs are clear to auscultation. Blood work and chest x-ray are negative.       Assessment & Plan:  Fever and chills - Plan: CBC with Differential/Platelet, Comprehensive metabolic panel, DG Chest 2 View Recommend supportive care with for Advil 3 times per day and treat this as a viral infection as his white blood count is relatively low. Ifgets worse he is to call.

## 2016-03-31 NOTE — Patient Instructions (Signed)
You can take 4 ibuprofen 3 times per day for fever, aches and pains.

## 2016-03-31 NOTE — Telephone Encounter (Signed)
Called pt to inform him per Dr.Lalonde that xray was normal blood work looked good lets treat this as a virus and if he has continued problem to please call pt verbalized understanding

## 2016-07-13 ENCOUNTER — Ambulatory Visit (INDEPENDENT_AMBULATORY_CARE_PROVIDER_SITE_OTHER): Payer: BLUE CROSS/BLUE SHIELD | Admitting: Medical

## 2016-07-13 ENCOUNTER — Encounter: Payer: Self-pay | Admitting: Medical

## 2016-07-13 VITALS — BP 120/88 | HR 73 | Wt 166.4 lb

## 2016-07-13 DIAGNOSIS — M62838 Other muscle spasm: Secondary | ICD-10-CM | POA: Diagnosis not present

## 2016-07-13 DIAGNOSIS — M436 Torticollis: Secondary | ICD-10-CM | POA: Diagnosis not present

## 2016-07-13 DIAGNOSIS — M542 Cervicalgia: Secondary | ICD-10-CM | POA: Diagnosis not present

## 2016-07-13 DIAGNOSIS — M549 Dorsalgia, unspecified: Secondary | ICD-10-CM | POA: Diagnosis not present

## 2016-07-13 DIAGNOSIS — L720 Epidermal cyst: Secondary | ICD-10-CM

## 2016-07-13 DIAGNOSIS — Z23 Encounter for immunization: Secondary | ICD-10-CM | POA: Diagnosis not present

## 2016-07-13 DIAGNOSIS — L989 Disorder of the skin and subcutaneous tissue, unspecified: Secondary | ICD-10-CM | POA: Diagnosis not present

## 2016-07-13 MED ORDER — CYCLOBENZAPRINE HCL 10 MG PO TABS: ORAL_TABLET | ORAL | 0 refills | Status: DC

## 2016-07-13 NOTE — Progress Notes (Signed)
Subjective: Chief Complaint  Patient presents with  . spot on top head    spot on top head  x2-3 weeks ago    Here for 2 issues.  He reports 2 skin lesions on scalp, left frontal and right parietal for less than a year, growing lesions.   Worried about these.  He tried cutting and freezing the left frontal lesion.  Sometimes mashes black debris out of this.   No family hx/o skin cancer.  He also c/o small little flesh colored bumps that pop up on upper outer arms  He notes several month history of some pain and stiffness in left neck and shoulder.  No specific injury or trauma.   Worse after lying down for long period of time.  No pain down arm.   No numbness tingling or weakness down left arm.   No other specific arm pain.   Is left handed, is a Clinical research associate.   Is moving all the time, does do some stretching daily.  No other aggravating or relieving factors. No other complaint.  Past Medical History:  Diagnosis Date  . Chronic headache   . Depression    10-12 years ago  . Full dentures   . Wears glasses    Current Outpatient Prescriptions on File Prior to Visit  Medication Sig Dispense Refill  . aspirin-acetaminophen-caffeine (EXCEDRIN MIGRAINE) 250-250-65 MG per tablet Take 1 tablet by mouth every 6 (six) hours as needed for pain. Reported on 01/15/2016    . esomeprazole (NEXIUM) 20 MG packet Take 20 mg by mouth daily before breakfast. 30 each 0  . pseudoephedrine (SUDAFED) 120 MG 12 hr tablet Take 120 mg by mouth every 12 (twelve) hours. Reported on 01/15/2016     No current facility-administered medications on file prior to visit.    ROS as in subjective   Objective: BP 120/88   Pulse 73   Wt 166 lb 6.4 oz (75.5 kg)   SpO2 99%   BMI 24.05 kg/m   Gen: wd, wn, nad Skin: male pattern baldness noted.  Left frontal scalp superior to forehead with 2 adjacent lesions, 4-37mm diameter raised flesh colored lesion with adjacent relatively flat brown/flesh colored lesion that is approx 51mm  diameter.   Right parietal scalp superiorly with roughly 1.5 cm diameter slightly raised but relatively flat somewhat rough lesion.  Both possibly seborrheic keratoses vs other  Neck: mild left lateral muscular tenderness, mildly reduced left lateral flexion and rotation, no other tenderness, mass, lymphadenopathy. Tender left upper back paraspinal as well as tenderness over rhomboids.  Otherwise back nontender MSK: left arm nontender, no mass, normal ROM, no deformity.  Right arm exam unremarkable Ext: no edema of UE Left arm neurovascularly intact Neck normal neuro function    Assessment: Encounter Diagnoses  Name Primary?  . Neck pain Yes  . Neck stiffness   . Muscle spasm   . Upper back pain   . Needs flu shot   . Skin lesion   . Milia      Plan: Neck pain, upper back pain muscle spasm -  Seems to be spasm vs mild strain.   He is a Clinical research associate, left handed, doing repetitive motion.  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  Can use Flexeril QHS prn, caution on sedation, can use NSAID prn short term, heat, massage.  Can consider chiropractor therapy.  Skin lesions - refer to dermatology   Milia of upper arms, bilat - mild, reassured  Counseled  on the influenza virus vaccine.  Vaccine information sheet given.  Influenza vaccine given after consent obtained.

## 2016-09-08 ENCOUNTER — Telehealth: Payer: Self-pay

## 2016-09-08 NOTE — Telephone Encounter (Signed)
Called pt to find out why he missed his appt. With skin surgery. He said that he is was at the dental place trying to his teeth fixed . He is going to give them a call to get his appt. R/s. Gave pt the phone to skin surgery.

## 2017-04-05 ENCOUNTER — Ambulatory Visit: Payer: BLUE CROSS/BLUE SHIELD | Admitting: Medical

## 2017-05-06 ENCOUNTER — Telehealth: Payer: Self-pay | Admitting: Medical

## 2017-05-06 ENCOUNTER — Encounter: Payer: Self-pay | Admitting: Family Medicine

## 2017-05-06 ENCOUNTER — Ambulatory Visit (INDEPENDENT_AMBULATORY_CARE_PROVIDER_SITE_OTHER): Payer: BLUE CROSS/BLUE SHIELD | Admitting: Family Medicine

## 2017-05-06 VITALS — BP 122/84 | HR 90 | Temp 98.8°F | Resp 16 | Wt 165.4 lb

## 2017-05-06 DIAGNOSIS — Z87891 Personal history of nicotine dependence: Secondary | ICD-10-CM

## 2017-05-06 DIAGNOSIS — L739 Follicular disorder, unspecified: Secondary | ICD-10-CM

## 2017-05-06 DIAGNOSIS — R05 Cough: Secondary | ICD-10-CM

## 2017-05-06 DIAGNOSIS — R058 Other specified cough: Secondary | ICD-10-CM

## 2017-05-06 DIAGNOSIS — R942 Abnormal results of pulmonary function studies: Secondary | ICD-10-CM | POA: Diagnosis not present

## 2017-05-06 MED ORDER — BENZONATATE 200 MG PO CAPS: 200.0000 mg | ORAL_CAPSULE | Freq: Two times a day (BID) | ORAL | 0 refills | Status: DC | PRN

## 2017-05-06 MED ORDER — ALBUTEROL SULFATE HFA 108 (90 BASE) MCG/ACT IN AERS
2.0000 | INHALATION_SPRAY | Freq: Four times a day (QID) | RESPIRATORY_TRACT | 0 refills | Status: DC | PRN
Start: 2017-05-06 — End: 2019-02-27

## 2017-05-06 MED ORDER — DOXYCYCLINE HYCLATE 100 MG PO TABS: 100.0000 mg | ORAL_TABLET | Freq: Two times a day (BID) | ORAL | 0 refills | Status: DC

## 2017-05-06 MED ORDER — ALBUTEROL SULFATE HFA 108 (90 BASE) MCG/ACT IN AERS: 2.0000 | INHALATION_SPRAY | Freq: Four times a day (QID) | RESPIRATORY_TRACT | 0 refills | Status: DC | PRN

## 2017-05-06 NOTE — Telephone Encounter (Signed)
Please take care of this. Proair HFA or Ventolin HFA

## 2017-05-06 NOTE — Telephone Encounter (Signed)
Sent in proair [facility]

## 2017-05-06 NOTE — Progress Notes (Signed)
Chief Complaint  Patient presents with  . cough    cough 4 weeks, increases on right side. laying flat it is worse. taking cough drops. pimples on back and shoulders and sore.     Subjective:  Justin Sellers is a 56 y.o. male who presents for a 4 week history of dry cough. States if he lays on his right side his cough is worse. Cough is worse at night while he is at work. Reports some chest tightness off and on but no wheezing.  States at the onset of cough he thinks he swallowed something. States something flew into his mouth.   Denies fever, chills, ear pain, sore throat, chest pain, palpitations, abdominal pain, N/V/D,   History of GERD and takes Nexium.   Stopped smoking in 2011.  History of bronchitis as a child and as an adult when he was smoking. No asthma, copd history per patient.   Treatment to date: cough drops, alka seltzer, sudafed, nyquil.  Denies sick contacts.  No other aggravating or relieving factors.    He also complains of bumps on his upper back and shoulder for the past few weeks. Slightly pruritic. No redness, warmth, drainage. States he picks at them.   ROS as in subjective.   Objective: Vitals:   05/06/17 1412 05/06/17 1438  BP:    Pulse:    Resp: 16   Temp:    SpO2: 93% 95%    General appearance: Alert, WD/WN, no distress, mildly ill appearing                             Skin: warm, dry. Small red bumps with white spots at the base of hair follicles on shoulders and upper back.                            Head: no sinus tenderness                            Eyes: conjunctiva normal, corneas clear, PERRLA                            Ears: pearly TMs, external ear canals normal                          Nose: septum midline, turbinates swollen, with erythema and clear discharge             Mouth/throat: MMM, tongue normal, mild pharyngeal erythema                           Neck: supple, no adenopathy, no thyromegaly, nontender  Heart: RRR, normal S1, S2, no murmurs                         Lungs: CTA bilaterally, somewhat diminished in the bases. , no wheezes, rales, or rhonchi      Assessment: Cough present for greater than 3 weeks - Plan: Spirometry with graph, doxycycline (VIBRA-TABS) 100 MG tablet, benzonatate (TESSALON) 200 MG capsule, CANCELED: DG Chest 2 View  Folliculitis  Former smoker - Plan: doxycycline (VIBRA-TABS) 100 MG tablet, CANCELED: DG Chest 2 View  Abnormal PFT   Plan: Abnormal PFT- moderate restriction. FEV1  2.84.  Albuterol nebulizer given in office. Patient reports some improvement. Pulse ox 96% post treatment.  Discussed diagnosis and treatment of persistent cough.  Discussed that his pulse ox is 93-95%, this may be his norm but due to course of illness and smoking history we will treat him with an antibiotic.  Doxycycline prescribed.  Suggested symptomatic OTC remedies. Albuterol and tessalon prescribed.  Discussed using dial soap or Lever 2000 for rash that is suspicious for folliculitis.  Call/return if not back to baseline after completing the antibiotic.

## 2017-05-06 NOTE — Patient Instructions (Signed)
Start the antibiotic. Increase your water intake. You can use the albuterol inhaler and Tessalon capsules for cough for the next few days.  If you are not back to baseline after completing the antibiotic then let us know.

## 2017-05-06 NOTE — Telephone Encounter (Signed)
Rcvd fax from [facility] stating that Proventil HFA is not covered by pt's insurance plan. The preferred alternative is Proair HFA, Proair Respiclick, Ventolin HFA.

## 2017-05-26 ENCOUNTER — Ambulatory Visit (INDEPENDENT_AMBULATORY_CARE_PROVIDER_SITE_OTHER): Payer: BLUE CROSS/BLUE SHIELD | Admitting: Medical

## 2017-05-26 ENCOUNTER — Encounter: Payer: Self-pay | Admitting: Medical

## 2017-05-26 VITALS — BP 110/68 | HR 80 | Temp 98.2°F | Wt 164.0 lb

## 2017-05-26 DIAGNOSIS — L739 Follicular disorder, unspecified: Secondary | ICD-10-CM | POA: Diagnosis not present

## 2017-05-26 DIAGNOSIS — R05 Cough: Secondary | ICD-10-CM | POA: Diagnosis not present

## 2017-05-26 DIAGNOSIS — R053 Chronic cough: Secondary | ICD-10-CM | POA: Insufficient documentation

## 2017-05-26 DIAGNOSIS — R635 Abnormal weight gain: Secondary | ICD-10-CM | POA: Diagnosis not present

## 2017-05-26 DIAGNOSIS — R9389 Abnormal findings on diagnostic imaging of other specified body structures: Secondary | ICD-10-CM

## 2017-05-26 DIAGNOSIS — N529 Male erectile dysfunction, unspecified: Secondary | ICD-10-CM | POA: Diagnosis not present

## 2017-05-26 DIAGNOSIS — R942 Abnormal results of pulmonary function studies: Secondary | ICD-10-CM | POA: Diagnosis not present

## 2017-05-26 MED ORDER — SILDENAFIL CITRATE 20 MG PO TABS: ORAL_TABLET | ORAL | 0 refills | Status: DC

## 2017-05-26 MED ORDER — UMECLIDINIUM-VILANTEROL 62.5-25 MCG/INH IN AEPB
1.0000 | INHALATION_SPRAY | Freq: Every day | RESPIRATORY_TRACT | 0 refills | Status: DC
Start: 2017-05-26 — End: 2017-06-01

## 2017-05-26 MED ORDER — HYDROCODONE-HOMATROPINE 5-1.5 MG/5ML PO SYRP: 5.0000 mL | ORAL_SOLUTION | Freq: Three times a day (TID) | ORAL | 0 refills | Status: DC | PRN

## 2017-05-26 MED ORDER — AMOXICILLIN 875 MG PO TABS: 875.0000 mg | ORAL_TABLET | Freq: Two times a day (BID) | ORAL | 0 refills | Status: DC

## 2017-05-26 NOTE — Patient Instructions (Signed)
Recommendations:  Erectile dysfunction - I sent generic Viagra/Sildenafil to [facility].    Start with 1 tablet of the generic Sildenafil, 30-45 minutes before intercourse.   If this doesn't work, then the next time you use this,you can use 2 tablets together instead of one tablet on a different day.  On subsequent trials of this medication you may find that it may take 3-4 tablets on a given day to see effective results.   You can use 1-5 tablets on any given day but no more than 5 max on a given day.  I am also checking your testosterone hormone today   Chronic cough: Begin Zyrtec or Allegra allergy pill once daily at bedtime to help with post nasal drainage Begin trial of Anoro inhaler, 1 puff daily.   Rinse your mouth out with water a few minutes after you use this We are referring you to Pulmonology given the abnormal breathing test and abnormal xray   Rash /folliculitis  Lets see if the rash resolves while on the Amoxicillin   Weight gain  We will check lab results to see if any causes

## 2017-05-26 NOTE — Progress Notes (Signed)
Subjective: Chief Complaint  Patient presents with  . coughing    coughing x 2 months    Here for several concerns.  He saw Vickie NP here few weeks ago for cough which continues unimproved.   He never got the albuterol due to cost.   Best boy didn't work.   He still has ongoing chronic cough x 2 months.  Has some post nasal drainage  As well.  No fever, no productive cough, no headache, no wheezing or SOB.    He does have hx/o occasional bronchitis.   Quit smoking 2011  He has lingering rash on left upper arm, without other rash.   No exposures, no known triggers.   Not using anything for treatment.  He notes ongoing problems with erectile dysfunction.  Programs getting and keeping erections.   He has gained weight unexpectedly in recent months.    Past Medical History:  Diagnosis Date  . Chronic headache   . Depression    10-12 years ago  . Full dentures   . Wears glasses    Current Outpatient Prescriptions on File Prior to Visit  Medication Sig Dispense Refill  . aspirin-acetaminophen-caffeine (EXCEDRIN MIGRAINE) 250-250-65 MG per tablet Take 1 tablet by mouth every 6 (six) hours as needed for pain. Reported on 01/15/2016    . esomeprazole (NEXIUM) 20 MG packet Take 20 mg by mouth daily before breakfast. 30 each 0  . pseudoephedrine (SUDAFED) 120 MG 12 hr tablet Take 120 mg by mouth every 12 (twelve) hours. Reported on 01/15/2016    . albuterol (PROAIR HFA) 108 (90 Base) MCG/ACT inhaler Inhale 2 puffs into the lungs every 6 (six) hours as needed for wheezing or shortness of breath. (Patient not taking: Reported on 05/26/2017) 18 g 0   No current facility-administered medications on file prior to visit.    Family History  Problem Relation Age of Onset  . Heart disease Mother 61       CABG  . Diabetes Mother   . Cancer Father        lung  . Hypertension Father   . Thyroid disease Sister   . Heart disease Paternal Grandmother   . Cancer Paternal Grandfather    lung  . Thyroid disease Sister   . Colon cancer Neg Hx    Past Surgical History:  Procedure Laterality Date  . COLONOSCOPY  05/22/13   Tubular adenoma, polyp, Dr. Deatra Ina; repeat in 5 years  . FRACTURE SURGERY  1970   left arm    ROS as in subjective    Objective: BP 110/68   Pulse 80   Temp 98.2 F (36.8 C)   Wt 164 lb (74.4 kg)   SpO2 97%   BMI 23.70 kg/m   Wt Readings from Last 3 Encounters:  05/26/17 164 lb (74.4 kg)  05/06/17 165 lb 6.4 oz (75 kg)  07/13/16 166 lb 6.4 oz (75.5 kg)   General appearance: Alert, WD/WN, no distress                             Skin: warm, no rash, no diaphoresis                           Head: no sinus tenderness                            Eyes:  conjunctiva normal, corneas clear, PERRLA                            Ears: pearly TMs, external ear canals normal                          Nose: septum midline, turbinates swollen, with erythema and clear discharge             Mouth/throat: MMM, tongue normal, mild pharyngeal erythema                           Neck: supple, no adenopathy, no thyromegaly, non tender                          Heart: RRR, normal S1, S2, no murmurs                         Lungs: decreased breath sounds in general, but no rhonchi, no wheezes, no rales                Extremities: no edema, non tender Pulses wnl        Assessment: Encounter Diagnoses  Name Primary?  . Chronic cough Yes  . Abnormal PFT   . Abnormal chest x-ray   . Folliculitis   . Weight gain   . Erectile dysfunction, unspecified erectile dysfunction type        Plan: Discussed his numerous concerns.   Labs today, referral to pulmonology as well.    Recommendations:  Erectile Dysfunction - Reviewed pathophysiology and differential diagnosis of erectile dysfunction with the patient.  Discussed treatment options.  Begin trial of sildenafil.  Discussed potential risks of medications including hypotension and priapism.  Discussed proper use  of medication.  Questions were answered.  Recheck 2wk  I sent generic Viagra/Sildenafil to [facility].    Start with 1 tablet of the generic Sildenafil, 30-45 minutes before intercourse.   If this doesn't work, then the next time you use this,you can use 2 tablets together instead of one tablet on a different day.  On subsequent trials of this medication you may find that it may take 3-4 tablets on a given day to see effective results.   You can use 1-5 tablets on any given day but no more than 5 max on a given day.  I am also checking your testosterone hormone today   Chronic cough: Begin Zyrtec or Allegra allergy pill once daily at bedtime to help with post nasal drainage Begin trial of Anoro inhaler, 1 puff daily.   Rinse your mouth out with water a few minutes after you use this We are referring you to Pulmonology given the abnormal breathing test and abnormal xray   Rash /folliculitis  Lets see if the rash resolves while on the Amoxicillin   Weight gain  We will check lab results to see if any causes   Sellers was seen today for coughing.  Diagnoses and all orders for this visit:  Chronic cough -     Ambulatory referral to Pulmonology  Abnormal PFT -     Ambulatory referral to Pulmonology  Abnormal chest x-ray -     Ambulatory referral to Pulmonology  Folliculitis -     CBC with Differential/Platelet  Weight gain -     Comprehensive  metabolic panel -     CBC with Differential/Platelet -     Hemoglobin A1c -     TSH  Erectile dysfunction, unspecified erectile dysfunction type -     Comprehensive metabolic panel -     CBC with Differential/Platelet -     Hemoglobin A1c -     TSH -     Testosterone  Other orders -     HYDROcodone-homatropine (HYCODAN) 5-1.5 MG/5ML syrup; Take 5 mLs by mouth every 8 (eight) hours as needed for cough. -     umeclidinium-vilanterol (ANORO ELLIPTA) 62.5-25 MCG/INH AEPB; Inhale 1 puff into the lungs daily. -     amoxicillin  (AMOXIL) 875 MG tablet; Take 1 tablet (875 mg total) by mouth 2 (two) times daily. -     sildenafil (REVATIO) 20 MG tablet; 1-5 tablets (20 mg to 100 mg) prior to sexual activity daily prn

## 2017-05-27 LAB — CBC WITH DIFFERENTIAL/PLATELET
BASOS PCT: 0.3 %
Basophils Absolute: 21 cells/uL (ref 0–200)
EOS PCT: 1.6 %
Eosinophils Absolute: 114 cells/uL (ref 15–500)
HEMATOCRIT: 44.9 % (ref 38.5–50.0)
HEMOGLOBIN: 15.6 g/dL (ref 13.2–17.1)
LYMPHS ABS: 2137 {cells}/uL (ref 850–3900)
MCH: 30.2 pg (ref 27.0–33.0)
MCHC: 34.7 g/dL (ref 32.0–36.0)
MCV: 87 fL (ref 80.0–100.0)
MPV: 11.1 fL (ref 7.5–12.5)
Monocytes Relative: 7.4 %
NEUTROS ABS: 4303 {cells}/uL (ref 1500–7800)
Neutrophils Relative %: 60.6 %
Platelets: 255 10*3/uL (ref 140–400)
RBC: 5.16 10*6/uL (ref 4.20–5.80)
RDW: 12.4 % (ref 11.0–15.0)
Total Lymphocyte: 30.1 %
WBC: 7.1 10*3/uL (ref 3.8–10.8)
WBCMIX: 525 {cells}/uL (ref 200–950)

## 2017-05-27 LAB — TESTOSTERONE: TESTOSTERONE: 328 ng/dL (ref 250–827)

## 2017-05-27 LAB — COMPREHENSIVE METABOLIC PANEL
AG RATIO: 2.4 (calc) (ref 1.0–2.5)
ALT: 26 U/L (ref 9–46)
AST: 20 U/L (ref 10–35)
Albumin: 4.3 g/dL (ref 3.6–5.1)
Alkaline phosphatase (APISO): 121 U/L — ABNORMAL HIGH (ref 40–115)
BILIRUBIN TOTAL: 0.5 mg/dL (ref 0.2–1.2)
BUN: 18 mg/dL (ref 7–25)
CALCIUM: 9.1 mg/dL (ref 8.6–10.3)
CO2: 27 mmol/L (ref 20–32)
Chloride: 103 mmol/L (ref 98–110)
Creat: 0.94 mg/dL (ref 0.70–1.33)
GLUCOSE: 119 mg/dL — AB (ref 65–99)
Globulin: 1.8 g/dL (calc) — ABNORMAL LOW (ref 1.9–3.7)
Potassium: 4.2 mmol/L (ref 3.5–5.3)
SODIUM: 139 mmol/L (ref 135–146)
TOTAL PROTEIN: 6.1 g/dL (ref 6.1–8.1)

## 2017-05-27 LAB — HEMOGLOBIN A1C
EAG (MMOL/L): 6 (calc)
Hgb A1c MFr Bld: 5.4 % of total Hgb (ref <5.7)
MEAN PLASMA GLUCOSE: 108 (calc)

## 2017-05-27 LAB — TSH: TSH: 2.69 mIU/L (ref 0.40–4.50)

## 2017-06-01 ENCOUNTER — Telehealth: Payer: Self-pay | Admitting: Medical

## 2017-06-01 ENCOUNTER — Ambulatory Visit (INDEPENDENT_AMBULATORY_CARE_PROVIDER_SITE_OTHER): Payer: BLUE CROSS/BLUE SHIELD | Admitting: Medical

## 2017-06-01 ENCOUNTER — Encounter: Payer: Self-pay | Admitting: Medical

## 2017-06-01 VITALS — BP 106/60 | HR 76 | Wt 162.0 lb

## 2017-06-01 DIAGNOSIS — R748 Abnormal levels of other serum enzymes: Secondary | ICD-10-CM | POA: Diagnosis not present

## 2017-06-01 DIAGNOSIS — N529 Male erectile dysfunction, unspecified: Secondary | ICD-10-CM | POA: Diagnosis not present

## 2017-06-01 DIAGNOSIS — R05 Cough: Secondary | ICD-10-CM

## 2017-06-01 DIAGNOSIS — R9389 Abnormal findings on diagnostic imaging of other specified body structures: Secondary | ICD-10-CM | POA: Diagnosis not present

## 2017-06-01 DIAGNOSIS — E291 Testicular hypofunction: Secondary | ICD-10-CM | POA: Insufficient documentation

## 2017-06-01 DIAGNOSIS — R7989 Other specified abnormal findings of blood chemistry: Secondary | ICD-10-CM | POA: Diagnosis not present

## 2017-06-01 DIAGNOSIS — R053 Chronic cough: Secondary | ICD-10-CM

## 2017-06-01 DIAGNOSIS — R942 Abnormal results of pulmonary function studies: Secondary | ICD-10-CM

## 2017-06-01 DIAGNOSIS — R5383 Other fatigue: Secondary | ICD-10-CM | POA: Diagnosis not present

## 2017-06-01 NOTE — Telephone Encounter (Signed)
Pt called and stated that the supplement he made reference to in his appt today is called [employer]. The list of ingredients is available on the website.

## 2017-06-01 NOTE — Telephone Encounter (Signed)
I can't find the exact supplement but it would seem he was taking a multivitamin and herbal supplement that include B vitamins, general multivitamin, B12, Gingko and Gensing.  I would recommend he go to his pharmacy and ask pharmacist to find him a cheaper generic that has a similar makeup of the supplements above.  [employer] sells similar supplements as well as the [employer]

## 2017-06-01 NOTE — Progress Notes (Signed)
Subjective: Chief Complaint  Patient presents with  . Results   Here for f/u from visit last week where he had several concerns.  Here to discuss lab results as well.  Last visit he had concerns about chronic cough.  We started Anoro, zyrtec, amoxicillin, and so far the cough has resolved attributed to amoxicillin and his history of bronchitis.     ALP elevated - no prior hx/o such.  ED - he hasn't picked up the generic Viagra yet.  Here to discuss labs, low T.   He notes ongoing problems with erectile dysfunction.  Programs getting and keeping erections.    Past Medical History:  Diagnosis Date  . Chronic headache   . Depression    10-12 years ago  . Full dentures   . Wears glasses    Current Outpatient Prescriptions on File Prior to Visit  Medication Sig Dispense Refill  . albuterol (PROAIR HFA) 108 (90 Base) MCG/ACT inhaler Inhale 2 puffs into the lungs every 6 (six) hours as needed for wheezing or shortness of breath. 18 g 0  . amoxicillin (AMOXIL) 875 MG tablet Take 1 tablet (875 mg total) by mouth 2 (two) times daily. 20 tablet 0  . aspirin-acetaminophen-caffeine (EXCEDRIN MIGRAINE) 824-235-36 MG per tablet Take 1 tablet by mouth every 6 (six) hours as needed for pain. Reported on 01/15/2016    . esomeprazole (NEXIUM) 20 MG packet Take 20 mg by mouth daily before breakfast. 30 each 0  . pseudoephedrine (SUDAFED) 120 MG 12 hr tablet Take 120 mg by mouth every 12 (twelve) hours. Reported on 01/15/2016    . sildenafil (REVATIO) 20 MG tablet 1-5 tablets (20 mg to 100 mg) prior to sexual activity daily prn 50 tablet 0   No current facility-administered medications on file prior to visit.    Family History  Problem Relation Age of Onset  . Heart disease Mother 107       CABG  . Diabetes Mother   . Cancer Father        lung  . Hypertension Father   . Thyroid disease Sister   . Heart disease Paternal Grandmother   . Cancer Paternal Grandfather        lung  . Thyroid disease  Sister   . Colon cancer Neg Hx    Past Surgical History:  Procedure Laterality Date  . COLONOSCOPY  05/22/13   Tubular adenoma, polyp, Dr. Deatra Ina; repeat in 5 years  . FRACTURE SURGERY  1970   left arm    ROS as in subjective    Objective: BP 106/60   Pulse 76   Wt 162 lb (73.5 kg)   SpO2 94%   BMI 23.41 kg/m   Wt Readings from Last 3 Encounters:  06/01/17 162 lb (73.5 kg)  05/26/17 164 lb (74.4 kg)  05/06/17 165 lb 6.4 oz (75 kg)   General appearance: Alert, WD/WN, no distress                              Assessment: Encounter Diagnoses  Name Primary?  . Alkaline phosphatase elevation Yes  . Erectile dysfunction, unspecified erectile dysfunction type   . Abnormal PFT   . Abnormal chest x-ray   . Chronic cough   . Low testosterone in male   . Hypogonadism in male   . Other fatigue        Plan: Reviewed the lab results from last week  ALP elevated -  after discussing possible causes, he will return in 70mo for ALP isoenzymes.  Consider ultrasound going forward  ED, low testosterone - begin trial script for Sildenafil generic.  He declines further eval for low testosterone today but will consider additional evaluation for this once he touches base with his insurer.    Abnormal PFT, abnormal CXR, chronic cough - referred to pulmonology.  Cough has improved greatly with round of antibiotic.    He will call back with the name of a vitamin he ordered on line that really helped his energy and focus.    Next visit consider the following labs: TST, FSH/LH, PSA, TSH, Prolactin, ALP isoenzymes.

## 2017-06-02 NOTE — Telephone Encounter (Signed)
Called spoke with pt about this

## 2017-06-24 ENCOUNTER — Encounter: Payer: Self-pay | Admitting: Internal Medicine

## 2017-07-08 ENCOUNTER — Other Ambulatory Visit (INDEPENDENT_AMBULATORY_CARE_PROVIDER_SITE_OTHER): Payer: BLUE CROSS/BLUE SHIELD

## 2017-07-08 DIAGNOSIS — Z23 Encounter for immunization: Secondary | ICD-10-CM | POA: Diagnosis not present

## 2018-05-04 ENCOUNTER — Encounter: Payer: Self-pay | Admitting: Medical

## 2018-05-04 ENCOUNTER — Ambulatory Visit
Admission: RE | Admit: 2018-05-04 | Discharge: 2018-05-04 | Disposition: A | Discharge status: Home / Self Care | Payer: Self-pay | Source: Ambulatory Visit | Attending: Medical | Admitting: Medical

## 2018-05-04 ENCOUNTER — Ambulatory Visit (INDEPENDENT_AMBULATORY_CARE_PROVIDER_SITE_OTHER): Payer: Self-pay | Admitting: Medical

## 2018-05-04 VITALS — BP 114/70 | HR 100 | Temp 98.6°F | Resp 16 | Ht 69.0 in | Wt 173.2 lb

## 2018-05-04 DIAGNOSIS — M25571 Pain in right ankle and joints of right foot: Secondary | ICD-10-CM

## 2018-05-04 DIAGNOSIS — M25471 Effusion, right ankle: Secondary | ICD-10-CM

## 2018-05-04 DIAGNOSIS — W108XXA Fall (on) (from) other stairs and steps, initial encounter: Secondary | ICD-10-CM

## 2018-05-04 MED ORDER — OXYCODONE-ACETAMINOPHEN 5-325 MG PO TABS: 1.0000 | ORAL_TABLET | ORAL | 0 refills | Status: DC | PRN

## 2018-05-04 NOTE — Progress Notes (Signed)
Subjective: Chief Complaint  Patient presents with  . rt foot    right foot injury X 05-03-18   Here for right foot and ankle injury.  Yesterday 05/03/2018, was coming down the stairwell at the parking garage for [employer] in [place].  When he stepped down on the landing he felt his foot twist and fell down to his right knee.  He was able to walk into [employer] and back and to the requiring her eyes but with pain.  Within the next hour or so he had swelling and pain in over the course of the last 12 hours has had bruising worse pain and swelling.  He is still ambulating somewhat but with pain.  No fever, no other symptoms.  Right foot feels, dull to sensation in general due to swelling.  No other aggravating or relieving factors. No other complaint.  Past Medical History:  Diagnosis Date  . Chronic headache   . Depression    10-12 years ago  . Full dentures   . Wears glasses    Current Outpatient Medications on File Prior to Visit  Medication Sig Dispense Refill  . aspirin-acetaminophen-caffeine (EXCEDRIN MIGRAINE) 250-250-65 MG per tablet Take 1 tablet by mouth every 6 (six) hours as needed for pain. Reported on 01/15/2016    . esomeprazole (NEXIUM) 20 MG packet Take 20 mg by mouth daily before breakfast. 30 each 0  . pseudoephedrine (SUDAFED) 120 MG 12 hr tablet Take 120 mg by mouth every 12 (twelve) hours. Reported on 01/15/2016    . albuterol (PROAIR HFA) 108 (90 Base) MCG/ACT inhaler Inhale 2 puffs into the lungs every 6 (six) hours as needed for wheezing or shortness of breath. (Patient not taking: Reported on 05/04/2018) 18 g 0  . sildenafil (REVATIO) 20 MG tablet 1-5 tablets (20 mg to 100 mg) prior to sexual activity daily prn (Patient not taking: Reported on 05/04/2018) 50 tablet 0   No current facility-administered medications on file prior to visit.    ROS as in subjective   Objective: BP 114/70   Pulse 100   Temp 98.6 F (37 C) (Oral)   Resp 16   Ht 5\' 9"  (1.753 m)    Wt 173 lb 3.2 oz (78.6 kg)   SpO2 95%   BMI 25.58 kg/m   Gen: wd, wn, nad There is moderate generalized swelling of the right ankle, there is purplish and red bruising along anterior and lateral ankle as well as along the lateral distal foot from lateral heel to about midfoot, tender over lateral malleolus, tender over anterior lateral ankle, tender over ATFL region, decreased range of motion in general due to pain and swelling, there seems to be a slight bony angulation at the lateral malleolus, able to bear weight somewhat but with pain. Cap refill within normal limits, pulses 1+ pedal pulses, otherwise calf and rest of right leg nontender without deformity Foot sensation within normal limits, although he complains of dullness to sensation he does have good response to sharp and light touch and two-point discrimination    Assessment: Encounter Diagnoses  Name Primary?  . Acute right ankle pain Yes  . Right ankle swelling   . Fall (on) (from) other stairs and steps, initial encounter     Plan: We will send for x-ray, advised over-the-counter ibuprofen, 4 tablets, 3 times daily for the next 4 to 5 days, ice/ cold therapy with bucket of cold water or ice water for 15 minutes 2-3 times daily, leg elevation,  rest the leg.  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  He will go for x-ray now.  We will call with results hopefully in a few hours  No concern for diversion of medications, he rarely comes in and when he does this usually not for pain seeking her worrisome reasons regarding controlled substances.  I prescribed a limited supply of oxycodone as below.  [place] was seen today for rt foot.  Diagnoses and all orders for this visit:  Acute right ankle pain -     DG Ankle Complete Right; Future  Right ankle swelling -     DG Ankle Complete Right; Future  Fall (on) (from) other stairs and steps, initial encounter -     DG Ankle  Complete Right; Future  Other orders -     oxyCODONE-acetaminophen (PERCOCET) 5-325 MG tablet; Take 1 tablet by mouth every 4 (four) hours as needed for severe pain.

## 2018-05-17 ENCOUNTER — Telehealth: Payer: Self-pay | Admitting: Medical

## 2018-05-17 NOTE — Telephone Encounter (Signed)
Patient notified of recommendations.  He still does not have insurance.

## 2018-05-17 NOTE — Telephone Encounter (Signed)
If he has insurance get in for recheck.   If no insurance, I recommend trying a CAM walker.  Please call out CAM walker brace to pharmacy or the following:  [facility] [address] [place], [place] [address] [phone] phone   I would wear this device for 2 weeks initially if still unable to bear weight well or if having a lot of pain still, but it would be better to have him come in for recheck exam

## 2018-05-17 NOTE — Telephone Encounter (Signed)
Pt came in and stated his ankle is still bothering him.  The bruising is getting better but is still swollen some what and still is very painful. Please advise pt at [phone] or [phone].

## 2018-06-20 ENCOUNTER — Encounter: Payer: Self-pay | Admitting: Gastroenterology

## 2018-10-26 ENCOUNTER — Telehealth: Payer: Self-pay | Admitting: Medical

## 2018-10-26 NOTE — Telephone Encounter (Signed)
Dismissal letter in guarantor snapshot  °

## 2019-02-27 ENCOUNTER — Ambulatory Visit (AMBULATORY_SURGERY_CENTER): Payer: Self-pay

## 2019-02-27 ENCOUNTER — Other Ambulatory Visit: Payer: Self-pay

## 2019-02-27 VITALS — Ht 69.0 in | Wt 160.0 lb

## 2019-02-27 DIAGNOSIS — Z86010 Personal history of colonic polyps: Secondary | ICD-10-CM

## 2019-02-27 MED ORDER — NA SULFATE-K SULFATE-MG SULF 17.5-3.13-1.6 GM/177ML PO SOLN: 1.0000 | Freq: Once | ORAL | 0 refills | Status: AC

## 2019-02-27 NOTE — Progress Notes (Signed)
Denies allergies to eggs or soy products. Denies complication of anesthesia or sedation. Denies use of weight loss medication. Denies use of O2.   [facility] instructions given for colonoscopy.  Pre-Visit was conducted by phone due to Covid 19. Instructions were reviewed and mailed to patients confirmed home address. A 15.00 coupon for Suprep was given to the patient. Patient was encouraged to call if he has any questions regarding the instructions.

## 2019-03-03 ENCOUNTER — Encounter: Payer: Self-pay | Admitting: Gastroenterology

## 2019-03-09 ENCOUNTER — Encounter: Payer: Self-pay | Admitting: Gastroenterology

## 2019-03-31 IMAGING — DX DG ANKLE COMPLETE 3+V*R*
3 series · 3 of 3 positions shown · non-contrast
Comparison: None.

CLINICAL DATA: Pain swelling and bruising

EXAM:
RIGHT ANKLE - COMPLETE 3+ VIEW

[dg ankle complete right (1 of 3)]
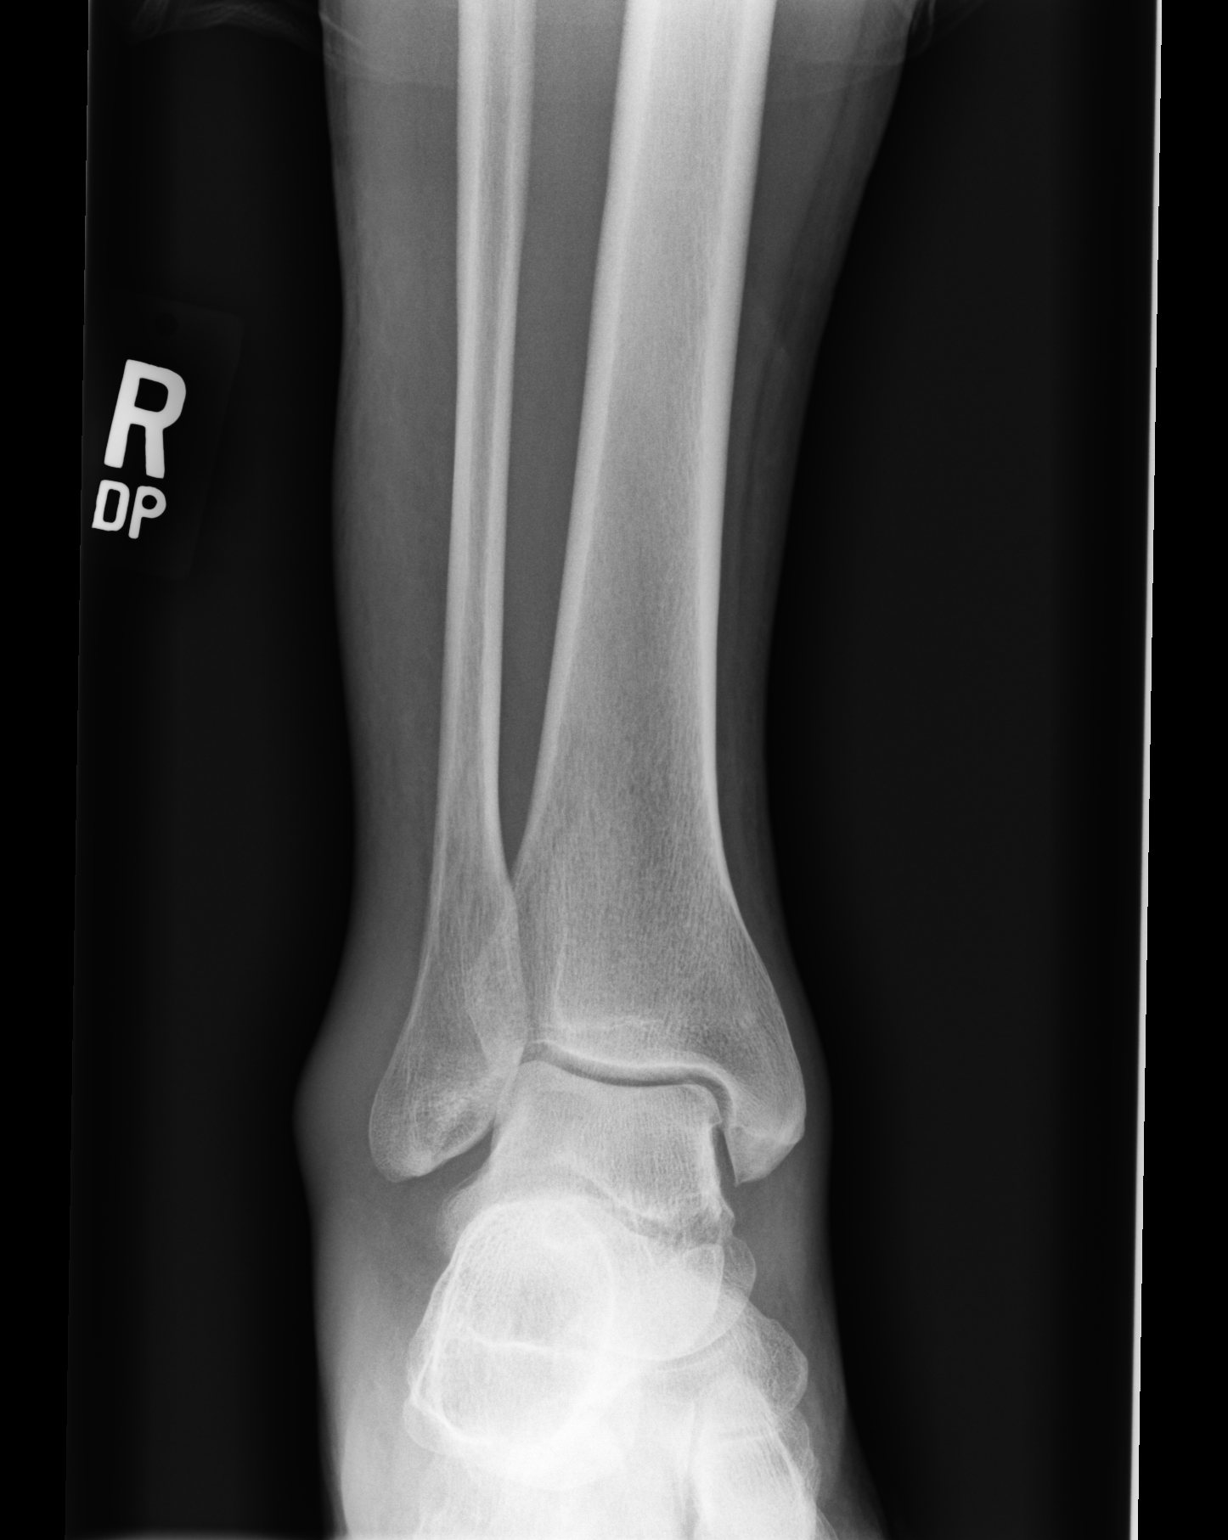

[dg ankle complete right (2 of 3)]
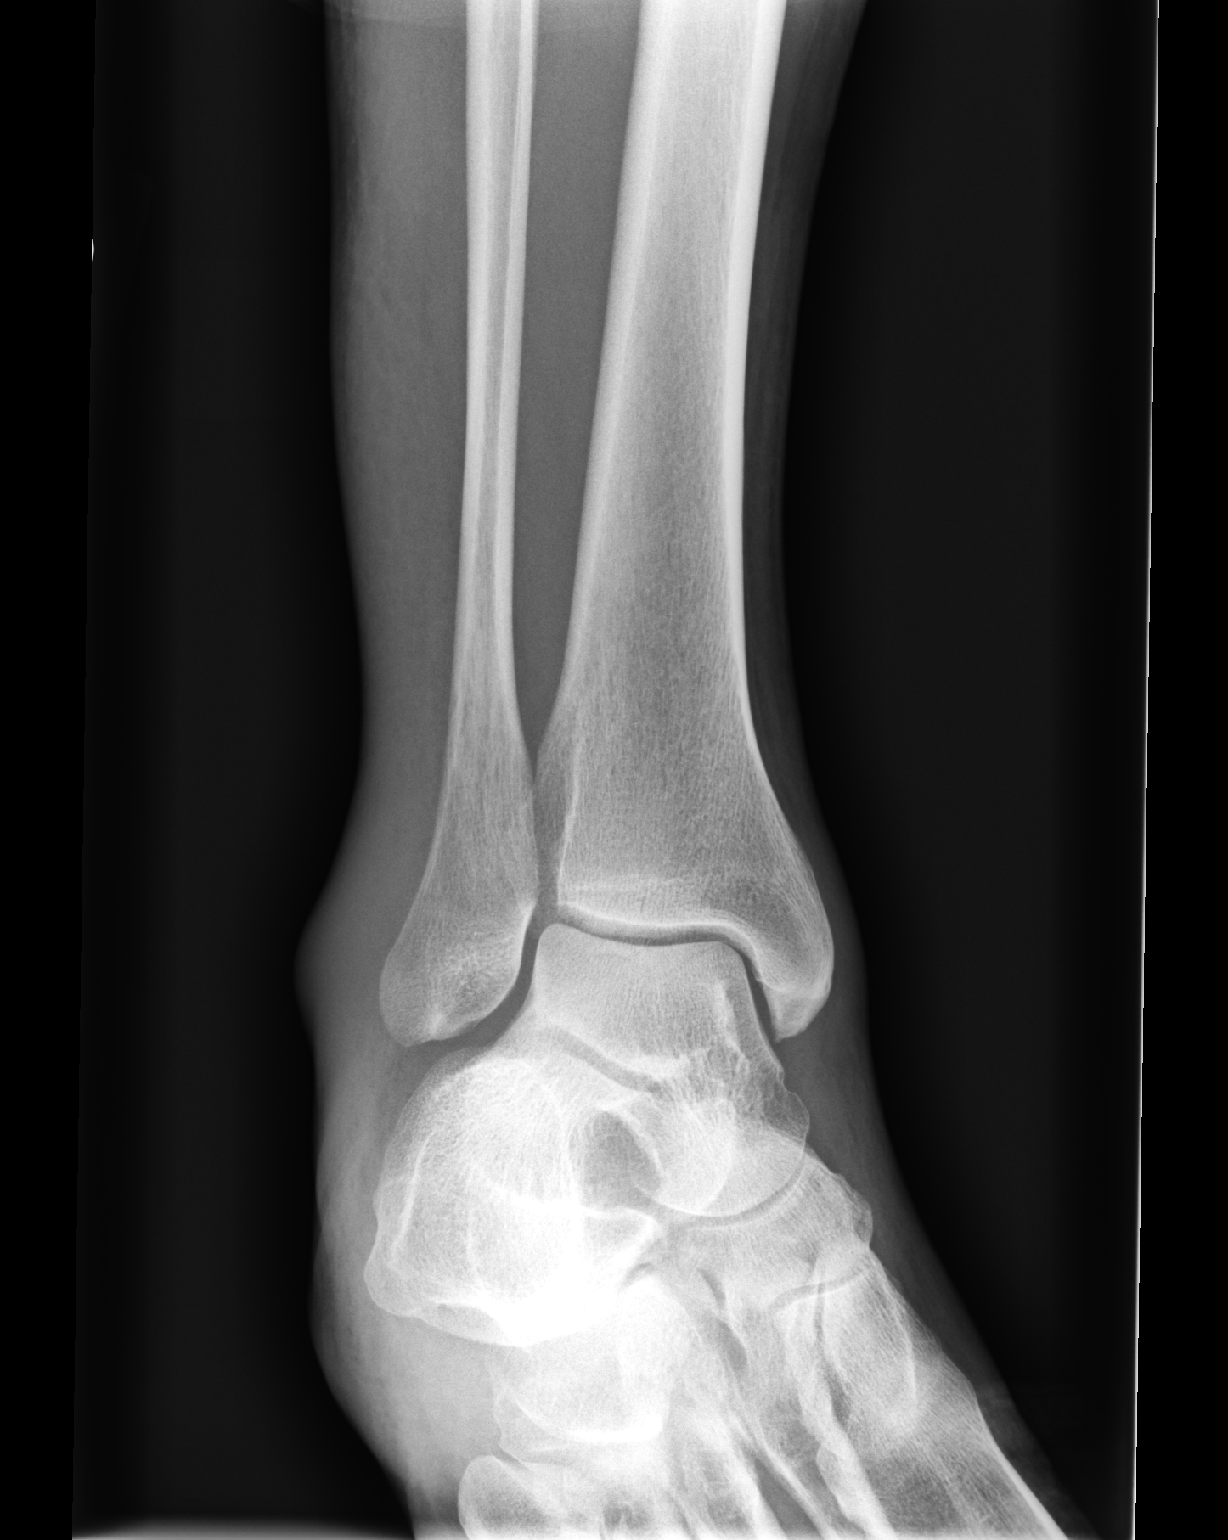

[dg ankle complete right (3 of 3)]
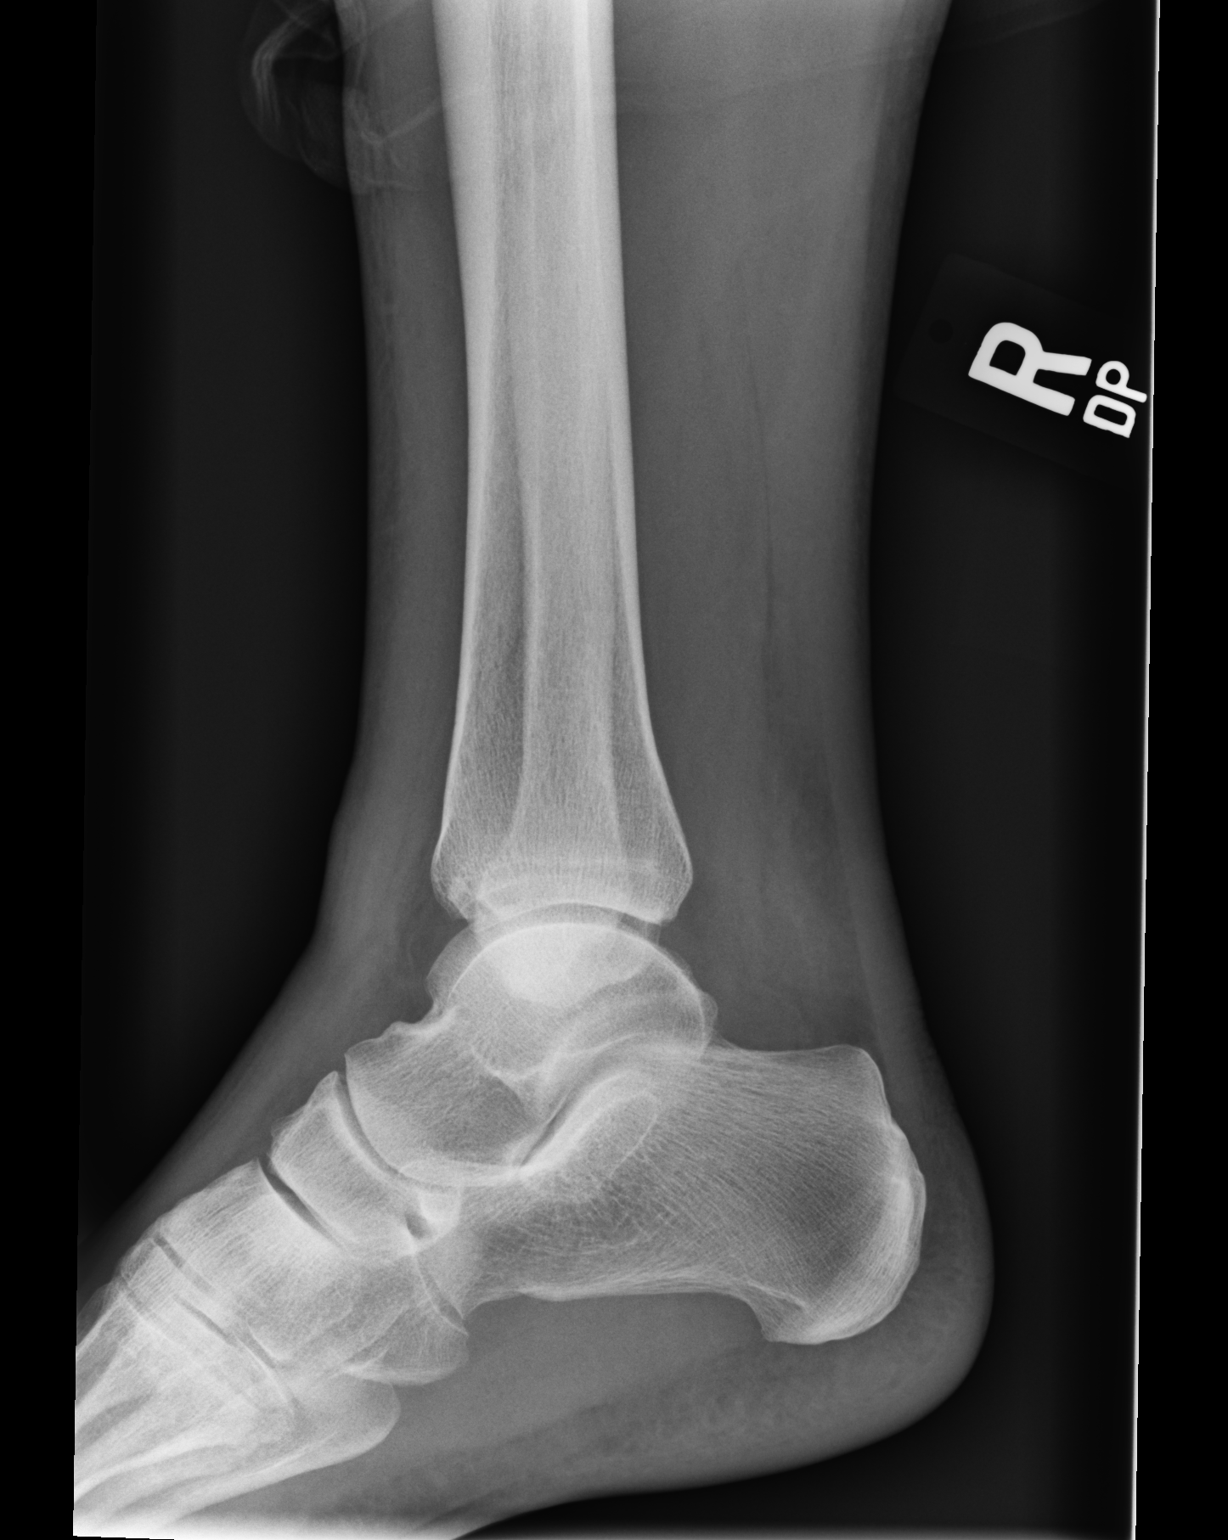

[3 of 3 positions shown; findings below may reference images not displayed]

FINDINGS: No fracture or malalignment. Ankle mortise is symmetric. Marked
lateral soft tissue swelling.
IMPRESSION: Marked soft tissue swelling.  No acute fracture is seen.

## 2019-08-25 HISTORY — PX: COLONOSCOPY: SHX174

## 2020-02-13 ENCOUNTER — Encounter: Payer: Self-pay | Admitting: Gastroenterology

## 2020-02-20 ENCOUNTER — Encounter: Payer: Self-pay | Admitting: Gastroenterology

## 2020-03-27 ENCOUNTER — Other Ambulatory Visit: Payer: Self-pay

## 2020-03-27 ENCOUNTER — Encounter: Payer: Self-pay | Admitting: Gastroenterology

## 2020-03-27 ENCOUNTER — Ambulatory Visit: Payer: BC Managed Care – PPO | Admitting: *Deleted

## 2020-03-27 VITALS — Ht 69.0 in | Wt 174.0 lb

## 2020-03-27 DIAGNOSIS — Z86010 Personal history of colonic polyps: Secondary | ICD-10-CM

## 2020-03-27 MED ORDER — NA SULFATE-K SULFATE-MG SULF 17.5-3.13-1.6 GM/177ML PO SOLN: 1.0000 | Freq: Once | ORAL | 0 refills | Status: AC

## 2020-03-27 NOTE — Progress Notes (Signed)

## 2020-04-04 ENCOUNTER — Encounter: Payer: BC Managed Care – PPO | Admitting: Gastroenterology

## 2020-05-23 ENCOUNTER — Ambulatory Visit (AMBULATORY_SURGERY_CENTER): Payer: BC Managed Care – PPO

## 2020-05-23 ENCOUNTER — Other Ambulatory Visit: Payer: Self-pay

## 2020-05-23 VITALS — Ht 69.0 in | Wt 177.0 lb

## 2020-05-23 DIAGNOSIS — Z86010 Personal history of colonic polyps: Secondary | ICD-10-CM

## 2020-05-23 MED ORDER — NA SULFATE-K SULFATE-MG SULF 17.5-3.13-1.6 GM/177ML PO SOLN: 1.0000 | Freq: Once | ORAL | 0 refills | Status: AC

## 2020-05-23 NOTE — Progress Notes (Signed)
No egg or soy allergy known to patient  No issues with past sedation with any surgeries or procedures No intubation problems in the past  No FH of Malignant Hyperthermia No diet pills per patient No home 02 use per patient  No blood thinners per patient  Pt denies issues with constipation  No A fib or A flutter  EMMI video via MyChart  COVID 19 guidelines implemented in PV today with Pt and RN  Coupon given to pt in PV today , Code to Pharmacy  COVID vaccines completed on 11/2019 per pt;  Due to the COVID-19 pandemic we are asking patients to follow these guidelines. Please only bring one care partner. Please be aware that your care partner may wait in the car in the parking lot or if they feel like they will be too hot to wait in the car, they may wait in the lobby on the 4th floor. All care partners are required to wear a mask the entire time (we do not have any that we can provide them), they need to practice social distancing, and we will do a Covid check for all patient's and care partners when you arrive. Also we will check their temperature and your temperature. If the care partner waits in their car they need to stay in the parking lot the entire time and we will call them on their cell phone when the patient is ready for discharge so they can bring the car to the front of the building. Also all patient's will need to wear a mask into building.  

## 2020-05-24 ENCOUNTER — Encounter: Payer: Self-pay | Admitting: Gastroenterology

## 2020-06-06 ENCOUNTER — Encounter: Payer: Self-pay | Admitting: Gastroenterology

## 2020-06-06 ENCOUNTER — Ambulatory Visit (AMBULATORY_SURGERY_CENTER): Payer: BC Managed Care – PPO | Admitting: Gastroenterology

## 2020-06-06 ENCOUNTER — Other Ambulatory Visit: Payer: Self-pay

## 2020-06-06 VITALS — BP 100/70 | HR 77 | Temp 97.8°F | Resp 13 | Ht 69.0 in | Wt 177.0 lb

## 2020-06-06 DIAGNOSIS — D125 Benign neoplasm of sigmoid colon: Secondary | ICD-10-CM

## 2020-06-06 DIAGNOSIS — Z86010 Personal history of colonic polyps: Secondary | ICD-10-CM | POA: Diagnosis present

## 2020-06-06 DIAGNOSIS — K635 Polyp of colon: Secondary | ICD-10-CM

## 2020-06-06 MED ORDER — SODIUM CHLORIDE 0.9 % IV SOLN: 500.0000 mL | Freq: Once | INTRAVENOUS | Status: DC

## 2020-06-06 NOTE — Progress Notes (Signed)
Report given to PACU, vss 

## 2020-06-06 NOTE — Progress Notes (Signed)
Pt. Reports no change in his medical or surgical history since his pre-visit 05/23/2020.

## 2020-06-06 NOTE — Op Note (Signed)
[facility] Patient Name: Justin Sellers Procedure Date: 06/06/2020 1:20 PM MRN: [id number] Endoscopist: Mallie Mussel L. Loletha Carrow , MD Age: 59 Referring MD:  Date of Birth: 02/24/1961 Gender: Male Account #: [id number] Procedure:                Colonoscopy Indications:              Surveillance: Personal history of adenomatous                            polyps on last colonoscopy > 5 years ago (one <                            95mm TA in 2014) Medicines:                Monitored Anesthesia Care Procedure:                Pre-Anesthesia Assessment:                           - Prior to the procedure, a History and Physical                            was performed, and patient medications and                            allergies were reviewed. The patient's tolerance of                            previous anesthesia was also reviewed. The risks                            and benefits of the procedure and the sedation                            options and risks were discussed with the patient.                            All questions were answered, and informed consent                            was obtained. Prior Anticoagulants: The patient has                            taken no previous anticoagulant or antiplatelet                            agents. ASA Grade Assessment: II - A patient with                            mild systemic disease. After reviewing the risks                            and benefits, the patient was deemed in  satisfactory condition to undergo the procedure.                           After obtaining informed consent, the colonoscope                            was passed under direct vision. Throughout the                            procedure, the patient's blood pressure, pulse, and                            oxygen saturations were monitored continuously. The                            Colonoscope was introduced through the anus and                             advanced to the the cecum, identified by                            appendiceal orifice and ileocecal valve. The                            colonoscopy was performed without difficulty. The                            patient tolerated the procedure well. The quality                            of the bowel preparation was good. The ileocecal                            valve, appendiceal orifice, and rectum were                            photographed. The bowel preparation used was SUPREP. Scope In: 1:27:33 PM Scope Out: 1:40:01 PM Scope Withdrawal Time: 0 hours 10 minutes 28 seconds  Total Procedure Duration: 0 hours 12 minutes 28 seconds  Findings:                 The perianal and digital rectal examinations were                            normal.                           A 3 mm polyp was found in the distal sigmoid colon.                            The polyp was sessile. The polyp was removed with a                            cold snare. Resection and retrieval were complete.  Multiple diverticula were found in the left colon.                           The exam was otherwise without abnormality on                            direct and retroflexion views. Complications:            No immediate complications. Estimated Blood Loss:     Estimated blood loss was minimal. Impression:               - One 3 mm polyp in the distal sigmoid colon,                            removed with a cold snare. Resected and retrieved.                           - Diverticulosis in the left colon.                           - The examination was otherwise normal on direct                            and retroflexion views. Recommendation:           - Patient has a contact number available for                            emergencies. The signs and symptoms of potential                            delayed complications were discussed with the                             patient. Return to normal activities tomorrow.                            Written discharge instructions were provided to the                            patient.                           - Resume previous diet.                           - Continue present medications.                           - Await pathology results.                           - Repeat colonoscopy is recommended for                            surveillance. The colonoscopy date will be  determined after pathology results from today's                            exam become available for review. Justin Sellers L. Loletha Carrow, MD 06/06/2020 1:43:56 PM This report has been signed electronically.

## 2020-06-06 NOTE — Patient Instructions (Signed)
Discharge instructions given. Handouts on polyps and Diverticulosis. Resume previous medications. YOU HAD AN ENDOSCOPIC PROCEDURE TODAY AT THE [facility]:   Refer to the procedure report that was given to you for any specific questions about what was found during the examination.  If the procedure report does not answer your questions, please call your gastroenterologist to clarify.  If you requested that your care partner not be given the details of your procedure findings, then the procedure report has been included in a sealed envelope for you to review at your convenience later.  YOU SHOULD EXPECT: Some feelings of bloating in the abdomen. Passage of more gas than usual.  Walking can help get rid of the air that was put into your GI tract during the procedure and reduce the bloating. If you had a lower endoscopy (such as a colonoscopy or flexible sigmoidoscopy) you may notice spotting of blood in your stool or on the toilet paper. If you underwent a bowel prep for your procedure, you may not have a normal bowel movement for a few days.  Please Note:  You might notice some irritation and congestion in your nose or some drainage.  This is from the oxygen used during your procedure.  There is no need for concern and it should clear up in a day or so.  SYMPTOMS TO REPORT IMMEDIATELY:   Following lower endoscopy (colonoscopy or flexible sigmoidoscopy):  Excessive amounts of blood in the stool  Significant tenderness or worsening of abdominal pains  Swelling of the abdomen that is new, acute  Fever of 100F or higher   For urgent or emergent issues, a gastroenterologist can be reached at any hour by calling [phone]. Do not use MyChart messaging for urgent concerns.    DIET:  We do recommend a small meal at first, but then you may proceed to your regular diet.  Drink plenty of fluids but you should avoid alcoholic beverages for 24 hours.  ACTIVITY:  You should plan to take  it easy for the rest of today and you should NOT DRIVE or use heavy machinery until tomorrow (because of the sedation medicines used during the test).    FOLLOW UP: Our staff will call the number listed on your records 48-72 hours following your procedure to check on you and address any questions or concerns that you may have regarding the information given to you following your procedure. If we do not reach you, we will leave a message.  We will attempt to reach you two times.  During this call, we will ask if you have developed any symptoms of COVID 19. If you develop any symptoms (ie: fever, flu-like symptoms, shortness of breath, cough etc.) before then, please call [phone].  If you test positive for Covid 19 in the 2 weeks post procedure, please call and report this information to us.    If any biopsies were taken you will be contacted by phone or by letter within the next 1-3 weeks.  Please call us at [phone] if you have not heard about the biopsies in 3 weeks.    SIGNATURES/CONFIDENTIALITY: You and/or your care partner have signed paperwork which will be entered into your electronic medical record.  These signatures attest to the fact that that the information above on your After Visit Summary has been reviewed and is understood.  Full responsibility of the confidentiality of this discharge information lies with you and/or your care-partner. 

## 2020-06-06 NOTE — Progress Notes (Signed)
Called to room to assist during endoscopic procedure.  Patient ID and intended procedure confirmed with present staff. Received instructions for my participation in the procedure from the performing physician.  

## 2020-06-10 ENCOUNTER — Telehealth: Payer: Self-pay

## 2020-06-10 NOTE — Telephone Encounter (Signed)
°  Follow up Call-  Call back number 06/06/2020  Post procedure Call Back phone  # [phone]  Permission to leave phone message Yes  Some recent data might be hidden     Patient questions:  Do you have a fever, pain , or abdominal swelling? No. Pain Score  0 *  Have you tolerated food without any problems? Yes.    Have you been able to return to your normal activities? Yes.    Do you have any questions about your discharge instructions: Diet   No. Medications  No. Follow up visit  No.  Do you have questions or concerns about your Care? No.  Actions: * If pain score is 4 or above: No action needed, pain <4.  1. Have you developed a fever since your procedure? no  2.   Have you had an respiratory symptoms (SOB or cough) since your procedure? no  3.   Have you tested positive for COVID 19 since your procedure no  4.   Have you had any family members/close contacts diagnosed with the COVID 19 since your procedure?  no   If yes to any of these questions please route to Joylene Frederic, RN and Joella Prince, RN

## 2020-06-19 ENCOUNTER — Encounter: Payer: Self-pay | Admitting: Gastroenterology

## 2021-02-04 ENCOUNTER — Ambulatory Visit (INDEPENDENT_AMBULATORY_CARE_PROVIDER_SITE_OTHER): Payer: BC Managed Care – PPO | Admitting: Medical

## 2021-02-04 ENCOUNTER — Other Ambulatory Visit: Payer: Self-pay

## 2021-02-04 VITALS — BP 114/80 | HR 86 | Temp 98.6°F | Ht 69.0 in | Wt 176.0 lb

## 2021-02-04 DIAGNOSIS — R059 Cough, unspecified: Secondary | ICD-10-CM | POA: Diagnosis not present

## 2021-02-04 DIAGNOSIS — Z87891 Personal history of nicotine dependence: Secondary | ICD-10-CM

## 2021-02-04 DIAGNOSIS — M722 Plantar fascial fibromatosis: Secondary | ICD-10-CM

## 2021-02-04 DIAGNOSIS — K219 Gastro-esophageal reflux disease without esophagitis: Secondary | ICD-10-CM | POA: Insufficient documentation

## 2021-02-04 DIAGNOSIS — R6889 Other general symptoms and signs: Secondary | ICD-10-CM | POA: Diagnosis not present

## 2021-02-04 DIAGNOSIS — G8929 Other chronic pain: Secondary | ICD-10-CM

## 2021-02-04 DIAGNOSIS — R911 Solitary pulmonary nodule: Secondary | ICD-10-CM | POA: Insufficient documentation

## 2021-02-04 DIAGNOSIS — R0989 Other specified symptoms and signs involving the circulatory and respiratory systems: Secondary | ICD-10-CM | POA: Diagnosis not present

## 2021-02-04 DIAGNOSIS — M25571 Pain in right ankle and joints of right foot: Secondary | ICD-10-CM | POA: Insufficient documentation

## 2021-02-04 DIAGNOSIS — R0981 Nasal congestion: Secondary | ICD-10-CM | POA: Insufficient documentation

## 2021-02-04 DIAGNOSIS — M7701 Medial epicondylitis, right elbow: Secondary | ICD-10-CM | POA: Insufficient documentation

## 2021-02-04 MED ORDER — OMEPRAZOLE 40 MG PO CPDR: 40.0000 mg | DELAYED_RELEASE_CAPSULE | Freq: Every day | ORAL | 1 refills | Status: DC

## 2021-02-04 NOTE — Progress Notes (Signed)
Subjective:  Justin Sellers is a 60 y.o. male who presents for Chief Complaint  Patient presents with   Cough    Ongoing cough for years that has gotten better. Now having sinus problem and elbow pain       Here for check in.  Last visit 2019.    He notes feels like sinuses drain to throat at night before bed, has to go spit into the toilet, feels clogged with mucous in throat before bed.  Some of this settles in stomach.   Gets up 1- 2 times per night to spit out mucous.  Uses sudafed regularly but still has a lot of drainage.   Has hx/o chronic cough but that eventually went away. However does get some cough with the mucous at night.  Every since had dentures and teet removed, has had this mucous problem.  Still has some congestion and drainage in the day time.  Not much sneezing.  Uses Nexium OTC regularly as well.  Sudafed helps but doesn't completely resolve the mucous.  He is a former smoke, quit in 2011.   No other aggravating or relieving factors.    He notes still getting pains in ankles from prior ankle injury years ago.   Also gets pains in bottom of feet daily starting in the morning.  No injury or trauma.  No paresthesias, no weakness.  Left handed.  He notes on and off for a while has had pains in his right medial elbow region.  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  No numbness or tingling.  No swelling.  No injury or trauma.  No other c/o.  Past Medical History:  Diagnosis Date   Chronic headache    Depression    at age [age]   Full dentures    GERD (gastroesophageal reflux disease)    on meds   Wears glasses    Current Outpatient Medications on File Prior to Visit  Medication Sig Dispense Refill   esomeprazole (NEXIUM) 20 MG packet Take 20 mg by mouth daily before breakfast. 30 each 0   pseudoephedrine (SUDAFED) 120 MG 12 hr tablet Take 120 mg by mouth every 12 (twelve) hours. Reported on 01/15/2016     No current  facility-administered medications on file prior to visit.     The following portions of the patient's history were reviewed and updated as appropriate: allergies, current medications, past family history, past medical history, past social history, past surgical history and problem list.  ROS Otherwise as in subjective above  Objective: BP 114/80   Pulse 86   Temp 98.6 F (37 C)   Ht 5\' 9"  (1.753 m)   Wt 176 lb (79.8 kg)   SpO2 94%   BMI 25.99 kg/m   Wt Readings from Last 3 Encounters:  02/04/21 176 lb (79.8 kg)  06/06/20 177 lb (80.3 kg)  05/23/20 177 lb (80.3 kg)    General appearance: alert, no distress, well developed, well nourished HEENT: normocephalic, sclerae anicteric, conjunctiva pink and moist,  nares patent, mild turbinate edema but no discharge or erythema, pharynx normal Oral cavity: MMM, no lesions Neck: supple, no lymphadenopathy, no thyromegaly, no masses Heart: RRR, normal S1, S2, no murmurs Lungs: CTA bilaterally, no wheezes, rhonchi, or rales Abdomen: +bs, soft, non tender, non distended, no masses, no hepatomegaly, no splenomegaly Pulses: 2+ radial pulses, 2+ pedal pulses, normal cap refill Ext: no edema Right medial epicondyle tender, mild  pain with resisted pronation of right hand otherwise nontender no deformity no swelling Arches drop when standing, tender in the bottom of the foot in general bilateral Arms neurovascularly intact   Assessment: Encounter Diagnoses  Name Primary?   Throat fullness Yes   Head congestion    Throat congestion    Cough    Former smoker    Medial epicondylitis, right    Gastroesophageal reflux disease, unspecified whether esophagitis present    Pulmonary nodule    Chronic pain of right ankle    Plantar fasciitis      Plan: Throat fullness, throat congestion, cough, former smoker Symptoms could be suggestive of acid reflux, mucus congestion, sinus congestion, or underlying lung or gastric issue We checked a  breathing test called spirometry today Begin omeprazole 40 mg daily instead of over-the-counter Nexium for the next 2 weeks.  Take this on empty stomach first thing in the morning approximately 45 minutes before breakfast Avoid spicy and acidic foods Go for chest x-ray If not much improved within the next 2 weeks, we might need to consider consult with specialist such as pulmonology and gastroenterology  History of pulmonary nodule-defer updated x-ray of chest  Please go to [facility] for your chest xray.   Their hours are 8am - 4:30 pm Monday - Friday.  Take your insurance card with you.  [facility] [phone]  [address], [place], [place] [address]  [address], [place] [address]   Right elbow tendinitis I recommend he try using arm sling periodically when possible to rest the arm for the next 1 to 2 weeks.  Even using this for an hour at a time could be helpful to rest the tendons Begin Aleve over-the-counter 1 tablet daily for the next week Use cool therapy such as bag of frozen peas or ice water bag to the elbow region 20 minutes at a time If not much improved over the next 2 weeks then we may need to refer to orthopedics  plantar fasciitis Every morning try doing a tennis ball massage with feet on the floor and towel stretch behind the ball of the foot to stretch the plantar fascia Order plantar fascitis night time 90 degree splints online, through [employer] for example.   They are typically about $25 each Avoid going barefoot since your feet flatten out without good arch support I recommend you use arch support shoe inserts.   This will help support the plantar fascia and arches You can use over the counter pain reliever for worse pain F/u in a month    Chronic ankle pain-follow-up with orthopedics.  He wants to try some home stretching we discussed today first, declines referral today  Justin Sellers was seen today for cough.  Diagnoses and  all orders for this visit:  Throat fullness  Head congestion  Throat congestion  Cough -     DG Chest 2 View; Future -     Spirometry with Graph  Former smoker -     DG Chest 2 View; Future -     Spirometry with Graph  Medial epicondylitis, right  Gastroesophageal reflux disease, unspecified whether esophagitis present  Pulmonary nodule -     DG Chest 2 View; Future  Chronic pain of right ankle  Plantar fasciitis  Other orders -     omeprazole (PRILOSEC) 40 MG capsule; Take 1 capsule (40 mg total) by mouth daily.   Follow up: pending xray

## 2021-02-04 NOTE — Patient Instructions (Signed)
  Throat fullness, throat congestion, cough, former smoker Symptoms could be suggestive of acid reflux, mucus congestion, sinus congestion, or underlying lung or gastric issue We checked a breathing test called spirometry today Begin omeprazole 40 mg daily instead of over-the-counter Nexium for the next 2 weeks.  Take this on empty stomach first thing in the morning approximately 45 minutes before breakfast Avoid spicy and acidic foods Go for chest x-ray If not much improved within the next 2 weeks, we might need to consider consult with specialist such as pulmonology and gastroenterology  History of pulmonary nodule-defer updated x-ray of chest  Please go to [facility] for your chest xray.   Their hours are 8am - 4:30 pm Monday - Friday.  Take your insurance card with you.  [facility] [phone]  [address], [place], [place] [address]  [address], [place] [address]   Right elbow tendinitis I recommend he try using arm sling periodically when possible to rest the arm for the next 1 to 2 weeks.  Even using this for an hour at a time could be helpful to rest the tendons Begin Aleve over-the-counter 1 tablet daily for the next week Use cool therapy such as bag of frozen peas or ice water bag to the elbow region 20 minutes at a time If not much improved over the next 2 weeks then we may need to refer to orthopedics

## 2021-03-10 ENCOUNTER — Ambulatory Visit
Admission: RE | Admit: 2021-03-10 | Discharge: 2021-03-10 | Disposition: A | Discharge status: Home / Self Care | Payer: BC Managed Care – PPO | Source: Ambulatory Visit | Attending: Medical | Admitting: Medical

## 2021-03-10 DIAGNOSIS — R059 Cough, unspecified: Secondary | ICD-10-CM

## 2021-03-10 DIAGNOSIS — Z87891 Personal history of nicotine dependence: Secondary | ICD-10-CM

## 2021-03-10 DIAGNOSIS — R911 Solitary pulmonary nodule: Secondary | ICD-10-CM

## 2021-10-14 ENCOUNTER — Ambulatory Visit (INDEPENDENT_AMBULATORY_CARE_PROVIDER_SITE_OTHER): Payer: BC Managed Care – PPO | Admitting: Medical

## 2021-10-14 ENCOUNTER — Encounter: Payer: Self-pay | Admitting: Medical

## 2021-10-14 ENCOUNTER — Other Ambulatory Visit: Payer: Self-pay

## 2021-10-14 VITALS — BP 124/80 | HR 94 | Ht 69.0 in | Wt 174.8 lb

## 2021-10-14 DIAGNOSIS — Z125 Encounter for screening for malignant neoplasm of prostate: Secondary | ICD-10-CM

## 2021-10-14 DIAGNOSIS — Z972 Presence of dental prosthetic device (complete) (partial): Secondary | ICD-10-CM

## 2021-10-14 DIAGNOSIS — Z87891 Personal history of nicotine dependence: Secondary | ICD-10-CM

## 2021-10-14 DIAGNOSIS — M2540 Effusion, unspecified joint: Secondary | ICD-10-CM

## 2021-10-14 DIAGNOSIS — G8929 Other chronic pain: Secondary | ICD-10-CM

## 2021-10-14 DIAGNOSIS — J309 Allergic rhinitis, unspecified: Secondary | ICD-10-CM | POA: Insufficient documentation

## 2021-10-14 DIAGNOSIS — Z13220 Encounter for screening for lipoid disorders: Secondary | ICD-10-CM | POA: Diagnosis not present

## 2021-10-14 DIAGNOSIS — K219 Gastro-esophageal reflux disease without esophagitis: Secondary | ICD-10-CM | POA: Diagnosis not present

## 2021-10-14 DIAGNOSIS — M25571 Pain in right ankle and joints of right foot: Secondary | ICD-10-CM

## 2021-10-14 DIAGNOSIS — Z0000 Encounter for general adult medical examination without abnormal findings: Secondary | ICD-10-CM | POA: Diagnosis not present

## 2021-10-14 DIAGNOSIS — K062 Gingival and edentulous alveolar ridge lesions associated with trauma: Secondary | ICD-10-CM | POA: Insufficient documentation

## 2021-10-14 DIAGNOSIS — R748 Abnormal levels of other serum enzymes: Secondary | ICD-10-CM

## 2021-10-14 DIAGNOSIS — J329 Chronic sinusitis, unspecified: Secondary | ICD-10-CM

## 2021-10-14 DIAGNOSIS — G479 Sleep disorder, unspecified: Secondary | ICD-10-CM

## 2021-10-14 LAB — LIPID PANEL

## 2021-10-14 MED ORDER — CETIRIZINE HCL 10 MG PO TABS: 10.0000 mg | ORAL_TABLET | Freq: Every day | ORAL | 3 refills | Status: DC

## 2021-10-14 MED ORDER — ESOMEPRAZOLE MAGNESIUM 40 MG PO CPDR: 40.0000 mg | DELAYED_RELEASE_CAPSULE | Freq: Every day | ORAL | 3 refills | Status: AC

## 2021-10-14 MED ORDER — AZELASTINE HCL 0.1 % NA SOLN: 1.0000 | Freq: Two times a day (BID) | NASAL | 2 refills | Status: DC

## 2021-10-14 NOTE — Progress Notes (Signed)
Subjective:   HPI  Justin Sellers is a 61 y.o. male who presents for Chief Complaint  Patient presents with   fasting cpe    Fasting cpe, no concerns, declines flu shot    Patient Care Team: Denman Pichardo, Leward Quan as PCP - General (Family Medicine) Sees dentist Sees eye doctor Dr. Wilfrid Lund, GI   Concerns: Still has ongoing sinus problems.  Spits a lot, gets daily congestion and drainage.  Using nexium and sudafed.  He sleeps on his right side as this seems to help his mucus some.  Sometimes has shoulder stiffness and soreness when he gets up.  He tends to sleep on his right shoulder.  Occasionally still gets right ankle swelling out of the blue.   No recent injury or trauma.  No current symptoms.  Former smoker, started age 65, quit 2011.  Smoked for about 40 years  Just recently had eye exam    Reviewed their medical, surgical, family, social, medication, and allergy history and updated chart as appropriate.  Past Medical History:  Diagnosis Date   Chronic headache    Depression    at age [age]   Full dentures    GERD (gastroesophageal reflux disease)    on meds   Wears glasses     Past Surgical History:  Procedure Laterality Date   COLONOSCOPY  05/22/2013   TA, semi peduc polyp, tics; Dr. Deatra Ina; repeat in 5 years   COLONOSCOPY  2021   hyperplastic polyp, repeat 10 years; Dr. Wilfrid Lund   FRACTURE SURGERY  1970   left arm   POLYPECTOMY     WISDOM TOOTH EXTRACTION      Family History  Problem Relation Age of Onset   Heart disease Mother 29       CABG   Diabetes Mother    Hypertension Father    Lung cancer Father    Thyroid disease Sister    Thyroid disease Sister    Heart disease Paternal Grandmother    Lung cancer Paternal Grandfather    Colon cancer Neg Hx    Esophageal cancer Neg Hx    Stomach cancer Neg Hx    Rectal cancer Neg Hx    Colon polyps Neg Hx      Current Outpatient Medications:    azelastine (ASTELIN) 0.1 % nasal spray,  Place 1 spray into both nostrils 2 (two) times daily. Use in each nostril as directed, Disp: 30 mL, Rfl: 2   cetirizine (ZYRTEC) 10 MG tablet, Take 1 tablet (10 mg total) by mouth at bedtime., Disp: 90 tablet, Rfl: 3   esomeprazole (NEXIUM) 40 MG capsule, Take 1 capsule (40 mg total) by mouth daily., Disp: 90 capsule, Rfl: 3  No Known Allergies   Review of Systems Constitutional: -fever, -chills, -sweats, -unexpected weight change, -decreased appetite, -fatigue Allergy: -sneezing, -itching, +congestion Dermatology: -changing moles, --rash, -lumps ENT: -runny nose, -ear pain, -sore throat, -hoarseness, +sinus pain, -teeth pain, - ringing in ears, -hearing loss, -nosebleeds Cardiology: -chest pain, -palpitations, -swelling, -difficulty breathing when lying flat, -waking up short of breath Respiratory: -cough, -shortness of breath, -difficulty breathing with exercise or exertion, -wheezing, -coughing up blood Gastroenterology: -abdominal pain, -nausea, -vomiting, -diarrhea, -constipation, -blood in stool, -changes in bowel movement, -difficulty swallowing or eating Hematology: -bleeding, -bruising  Musculoskeletal: -joint aches, -muscle aches, -joint swelling, -back pain, -neck pain, -cramping, -changes in gait Ophthalmology: denies vision changes, eye redness, itching, discharge Urology: -burning with urination, -difficulty urinating, -blood in urine, -urinary frequency, -  urgency, -incontinence Neurology: -headache, -weakness, -tingling, -numbness, -memory loss, -falls, -dizziness Psychology: -depressed mood, -agitation, -sleep problems Male GU: no testicular mass, pain, no lymph nodes swollen, no swelling, no rash.  Depression screen [facility] 2/9 10/14/2021 02/04/2021  Decreased Interest 0 0  Down, Depressed, Hopeless 0 0  PHQ - 2 Score 0 0        Objective:  BP 124/80    Pulse 94    Ht 5\' 9"  (1.753 m)    Wt 174 lb 12.8 oz (79.3 kg)    BMI 25.81 kg/m   General appearance: alert, no  distress, WD/WN, Caucasian male Skin: unremarkable HEENT: normocephalic, conjunctiva/corneas normal, sclerae anicteric, PERRLA, EOMi, nares patent, no discharge or erythema, pharynx normal Oral cavity: MMM, tongue normal, upper denture Neck: supple, no lymphadenopathy, no thyromegaly, no masses, normal ROM, no bruits Chest: non tender, normal shape and expansion Heart: RRR, normal S1, S2, no murmurs Lungs: CTA bilaterally, no wheezes, rhonchi, or rales Abdomen: +bs, soft, non tender, non distended, no masses, no hepatomegaly, no splenomegaly, no bruits Back: non tender, normal ROM, no scoliosis Musculoskeletal: mild pain with right shoulder ROM with apprehension test, otherwise nontender, no swelling, ROM seems normal, negative special tests, no obvious ankle tendnerss or swelling, otherwise upper extremities non tender, no obvious deformity, normal ROM throughout, lower extremities non tender, no obvious deformity, normal ROM throughout Extremities: no edema, no cyanosis, no clubbing Pulses: 2+ symmetric, upper and lower extremities, normal cap refill Neurological: alert, oriented x 3, CN2-12 intact, strength normal upper extremities and lower extremities, sensation normal throughout, DTRs 2+ throughout, no cerebellar signs, gait normal Psychiatric: normal affect, behavior normal, pleasant  GU/rectal - deferred   Assessment and Plan :   Encounter Diagnoses  Name Primary?   Encounter for health maintenance examination in adult Yes   Gastroesophageal reflux disease, unspecified whether esophagitis present    Screening for prostate cancer    Former smoker    Alkaline phosphatase elevation    Chronic pain of right ankle    Wears dentures    Joint swelling    Screening for lipid disorders    Allergic rhinitis, unspecified seasonality, unspecified trigger    Chronic congestion of paranasal sinus    Sleep disturbance     This visit was a preventative care visit, also known as wellness  visit or routine physical.   Topics typically include healthy lifestyle, diet, exercise, preventative care, vaccinations, sick and well care, proper use of emergency dept and after hours care, as well as other concerns.     Recommendations: Continue to return yearly for your annual wellness and preventative care visits.  This gives [place] a chance to discuss healthy lifestyle, exercise, vaccinations, review your chart record, and perform screenings where appropriate.  I recommend you see your eye doctor yearly for routine vision care.  I recommend you see your dentist yearly for routine dental care including hygiene visits twice yearly.   Vaccination recommendations were reviewed Immunization History  Administered Date(s) Administered   Influenza Inj Mdck Quad Pf 06/09/2018   Influenza,inj,Quad PF,6+ Mos 07/11/2014, 06/11/2015, 07/13/2016, 07/08/2017   PFIZER(Purple Top)SARS-COV-2 Vaccination 11/11/2019, 12/03/2019, 07/25/2020   I don't have updated vaccines records on file for you for the following vaccines . Thus, you are due for the following vaccines:  Tetanus booster every 10 years  Shingrix vaccines, 2 doses, 2 months apart  Pneumococcal vaccine every 5 years  Yearly flu shot in the fall.   Screening for cancer: Colon cancer screening: I  reviewed your colonoscopy on file that is up to date from 2021, due repeat in 10 years.  We discussed PSA, prostate exam, and prostate cancer screening risks/benefits.     Skin cancer screening: Check your skin regularly for new changes, growing lesions, or other lesions of concern Come in for evaluation if you have skin lesions of concern.  Lung cancer screening: If you have a greater than 20 pack year history of tobacco use, then you may qualify for lung cancer screening with a chest CT scan.   Please call your insurance company to inquire about coverage for this test.  Check insurance coverage for this given your former smoking  history.   We currently don't have screenings for other cancers besides breast, cervical, colon, and lung cancers.  If you have a strong family history of cancer or have other cancer screening concerns, please let me know.    Bone health: Get at least 150 minutes of aerobic exercise weekly Get weight bearing exercise at least once weekly Bone density test:  A bone density test is an imaging test that uses a type of X-ray to measure the amount of calcium and other minerals in your bones. The test may be used to diagnose or screen you for a condition that causes weak or thin bones (osteoporosis), predict your risk for a broken bone (fracture), or determine how well your osteoporosis treatment is working. The bone density test is recommended for females 51 and older, or females or males <06 if certain risk factors such as thyroid disease, long term use of steroids such as for asthma or rheumatological issues, vitamin D deficiency, estrogen deficiency, family history of osteoporosis, self or family history of fragility fracture in first degree relative.    Heart health: Get at least 150 minutes of aerobic exercise weekly Limit alcohol It is important to maintain a healthy blood pressure and healthy cholesterol numbers  Heart disease screening: Screening for heart disease includes screening for blood pressure, fasting lipids, glucose/diabetes screening, BMI height to weight ratio, reviewed of smoking status, physical activity, and diet.    Goals include blood pressure 120/80 or less, maintaining a healthy lipid/cholesterol profile, preventing diabetes or keeping diabetes numbers under good control, not smoking or using tobacco products, exercising most days per week or at least 150 minutes per week of exercise, and eating healthy variety of fruits and vegetables, healthy oils, and avoiding unhealthy food choices like fried food, fast food, high sugar and high cholesterol foods.    Other tests  may possibly include EKG test, CT coronary calcium score, echocardiogram, exercise treadmill stress test.     Medical care options: I recommend you continue to seek care here first for routine care.  We try really hard to have available appointments Monday through Friday daytime hours for sick visits, acute visits, and physicals.  Urgent care should be used for after hours and weekends for significant issues that cannot wait till the next day.  The emergency department should be used for significant potentially life-threatening emergencies.  The emergency department is expensive, can often have long wait times for less significant concerns, so try to utilize primary care, urgent care, or telemedicine when possible to avoid unnecessary trips to the emergency department.  Virtual visits and telemedicine have been introduced since the pandemic started in 2020, and can be convenient ways to receive medical care.  We offer virtual appointments as well to assist you in a variety of options to seek medical care.  Advanced Directives: I recommend you consider completing a [place] and Living Will.   These documents respect your wishes and help alleviate burdens on your loved ones if you were to become terminally ill or be in a position to need those documents enforced.    You can complete Advanced Directives yourself, have them notarized, then have copies made for our office, for you and for anybody you feel should have them in safe keeping.  Or, you can have an attorney prepare these documents.   If you haven't updated your Last Will and Testament in a while, it may be worthwhile having an attorney prepare these documents together and save on some costs.       Separate significant issues discussed: Acid reflux-begin prescription Nexium.  If this is not covered by insurance then let me know and I will send in something else.  This should be taken about 45 minutes before breafkast.   Avoid acid reflux triggers such as spicy and acidic foods  Allergies and nasal congestion Begin Zyrtec cetirizine daily at bedtime, begin Astelin nasal spray daily in the morning Consider nasal saline flush once or twice daily to flush out mucus If you still do not see improvements over the next month then it might be worth seeing an ear nose and throat specialist.  We can refer you for this  Alkaline phosphatase elevation on prior lab.  We are rechecking this today.   Gentry was seen today for fasting cpe.  Diagnoses and all orders for this visit:  Encounter for health maintenance examination in adult -     Comprehensive metabolic panel -     CBC -     Lipid panel -     PSA -     VITAMIN D 25 Hydroxy (Vit-D Deficiency, Fractures) -     Uric acid -     Sedimentation rate  Gastroesophageal reflux disease, unspecified whether esophagitis present  Screening for prostate cancer -     PSA  Former smoker  Alkaline phosphatase elevation -     VITAMIN D 25 Hydroxy (Vit-D Deficiency, Fractures)  Chronic pain of right ankle -     Uric acid -     Sedimentation rate  Wears dentures  Joint swelling -     Uric acid -     Sedimentation rate  Screening for lipid disorders -     Lipid panel  Allergic rhinitis, unspecified seasonality, unspecified trigger  Chronic congestion of paranasal sinus  Sleep disturbance  Other orders -     esomeprazole (NEXIUM) 40 MG capsule; Take 1 capsule (40 mg total) by mouth daily. -     cetirizine (ZYRTEC) 10 MG tablet; Take 1 tablet (10 mg total) by mouth at bedtime. -     azelastine (ASTELIN) 0.1 % nasal spray; Place 1 spray into both nostrils 2 (two) times daily. Use in each nostril as directed   Follow-up pending labs, yearly for physical

## 2021-10-14 NOTE — Patient Instructions (Signed)
This visit was a preventative care visit, also known as wellness visit or routine physical.   Topics typically include healthy lifestyle, diet, exercise, preventative care, vaccinations, sick and well care, proper use of emergency dept and after hours care, as well as other concerns.     Recommendations: Continue to return yearly for your annual wellness and preventative care visits.  This gives [place] a chance to discuss healthy lifestyle, exercise, vaccinations, review your chart record, and perform screenings where appropriate.  I recommend you see your eye doctor yearly for routine vision care.  I recommend you see your dentist yearly for routine dental care including hygiene visits twice yearly.   Vaccination recommendations were reviewed Immunization History  Administered Date(s) Administered   Influenza Inj Mdck Quad Pf 06/09/2018   Influenza,inj,Quad PF,6+ Mos 07/11/2014, 06/11/2015, 07/13/2016, 07/08/2017   PFIZER(Purple Top)SARS-COV-2 Vaccination 11/11/2019, 12/03/2019, 07/25/2020   I don't have updated vaccines records on file for you for the following vaccines . Thus, you are due for the following vaccines:  Tetanus booster every 10 years  Shingrix vaccines, 2 doses, 2 months apart  Pneumococcal vaccine every 5 years  Yearly flu shot in the fall.   Screening for cancer: Colon cancer screening: I reviewed your colonoscopy on file that is up to date from 2021, due repeat in 10 years.  We discussed PSA, prostate exam, and prostate cancer screening risks/benefits.     Skin cancer screening: Check your skin regularly for new changes, growing lesions, or other lesions of concern Come in for evaluation if you have skin lesions of concern.  Lung cancer screening: If you have a greater than 20 pack year history of tobacco use, then you may qualify for lung cancer screening with a chest CT scan.   Please call your insurance company to inquire about coverage for this  test.  Check insurance coverage for this given your former smoking history.   We currently don't have screenings for other cancers besides breast, cervical, colon, and lung cancers.  If you have a strong family history of cancer or have other cancer screening concerns, please let me know.    Bone health: Get at least 150 minutes of aerobic exercise weekly Get weight bearing exercise at least once weekly Bone density test:  A bone density test is an imaging test that uses a type of X-ray to measure the amount of calcium and other minerals in your bones. The test may be used to diagnose or screen you for a condition that causes weak or thin bones (osteoporosis), predict your risk for a broken bone (fracture), or determine how well your osteoporosis treatment is working. The bone density test is recommended for females 91 and older, or females or males <60 if certain risk factors such as thyroid disease, long term use of steroids such as for asthma or rheumatological issues, vitamin D deficiency, estrogen deficiency, family history of osteoporosis, self or family history of fragility fracture in first degree relative.    Heart health: Get at least 150 minutes of aerobic exercise weekly Limit alcohol It is important to maintain a healthy blood pressure and healthy cholesterol numbers  Heart disease screening: Screening for heart disease includes screening for blood pressure, fasting lipids, glucose/diabetes screening, BMI height to weight ratio, reviewed of smoking status, physical activity, and diet.    Goals include blood pressure 120/80 or less, maintaining a healthy lipid/cholesterol profile, preventing diabetes or keeping diabetes numbers under good control, not smoking or using tobacco products, exercising most  days per week or at least 150 minutes per week of exercise, and eating healthy variety of fruits and vegetables, healthy oils, and avoiding unhealthy food choices like fried food,  fast food, high sugar and high cholesterol foods.    Other tests may possibly include EKG test, CT coronary calcium score, echocardiogram, exercise treadmill stress test.     Medical care options: I recommend you continue to seek care here first for routine care.  We try really hard to have available appointments Monday through Friday daytime hours for sick visits, acute visits, and physicals.  Urgent care should be used for after hours and weekends for significant issues that cannot wait till the next day.  The emergency department should be used for significant potentially life-threatening emergencies.  The emergency department is expensive, can often have long wait times for less significant concerns, so try to utilize primary care, urgent care, or telemedicine when possible to avoid unnecessary trips to the emergency department.  Virtual visits and telemedicine have been introduced since the pandemic started in 2020, and can be convenient ways to receive medical care.  We offer virtual appointments as well to assist you in a variety of options to seek medical care.   Advanced Directives: I recommend you consider completing a [place] and Living Will.   These documents respect your wishes and help alleviate burdens on your loved ones if you were to become terminally ill or be in a position to need those documents enforced.    You can complete Advanced Directives yourself, have them notarized, then have copies made for our office, for you and for anybody you feel should have them in safe keeping.  Or, you can have an attorney prepare these documents.   If you haven't updated your Last Will and Testament in a while, it may be worthwhile having an attorney prepare these documents together and save on some costs.       Separate significant issues discussed: Acid reflux-begin prescription Nexium.  If this is not covered by insurance then let me know and I will send in something  else.  This should be taken about 45 minutes before breafkast.  Avoid acid reflux triggers such as spicy and acidic foods  Allergies and nasal congestion Begin Zyrtec cetirizine daily at bedtime, begin Astelin nasal spray daily in the morning Consider nasal saline flush once or twice daily to flush out mucus If you still do not see improvements over the next month then it might be worth seeing an ear nose and throat specialist.  We can refer you for this  Alkaline phosphatase elevation on prior lab.  We are rechecking this today.

## 2021-10-15 ENCOUNTER — Other Ambulatory Visit: Payer: Self-pay | Admitting: Medical

## 2021-10-15 LAB — CBC
Hematocrit: 49.2 % (ref 37.5–51.0)
Hemoglobin: 17 g/dL (ref 13.0–17.7)
MCH: 30.6 pg (ref 26.6–33.0)
MCHC: 34.6 g/dL (ref 31.5–35.7)
MCV: 89 fL (ref 79–97)
Platelets: 281 10*3/uL (ref 150–450)
RBC: 5.56 x10E6/uL (ref 4.14–5.80)
RDW: 12.3 % (ref 11.6–15.4)
WBC: 7.3 10*3/uL (ref 3.4–10.8)

## 2021-10-15 LAB — COMPREHENSIVE METABOLIC PANEL
ALT: 30 IU/L (ref 0–44)
AST: 27 IU/L (ref 0–40)
Albumin/Globulin Ratio: 2 (ref 1.2–2.2)
Albumin: 4.6 g/dL (ref 3.8–4.9)
Alkaline Phosphatase: 132 IU/L — ABNORMAL HIGH (ref 44–121)
BUN/Creatinine Ratio: 21 — ABNORMAL HIGH (ref 9–20)
BUN: 20 mg/dL (ref 6–24)
Bilirubin Total: 0.5 mg/dL (ref 0.0–1.2)
CO2: 27 mmol/L (ref 20–29)
Calcium: 9.3 mg/dL (ref 8.7–10.2)
Chloride: 101 mmol/L (ref 96–106)
Creatinine, Ser: 0.95 mg/dL (ref 0.76–1.27)
Globulin, Total: 2.3 g/dL (ref 1.5–4.5)
Glucose: 117 mg/dL — ABNORMAL HIGH (ref 70–99)
Potassium: 4.9 mmol/L (ref 3.5–5.2)
Sodium: 141 mmol/L (ref 134–144)
Total Protein: 6.9 g/dL (ref 6.0–8.5)
eGFR: 92 mL/min/{1.73_m2} (ref >59)

## 2021-10-15 LAB — LIPID PANEL
Chol/HDL Ratio: 5.5 ratio — ABNORMAL HIGH (ref 0.0–5.0)
Cholesterol, Total: 188 mg/dL (ref 100–199)
HDL: 34 mg/dL — ABNORMAL LOW (ref >39)
LDL Chol Calc (NIH): 126 mg/dL — ABNORMAL HIGH (ref 0–99)
Triglycerides: 158 mg/dL — ABNORMAL HIGH (ref 0–149)
VLDL Cholesterol Cal: 28 mg/dL (ref 5–40)

## 2021-10-15 LAB — SEDIMENTATION RATE: Sed Rate: 11 mm/hr (ref 0–30)

## 2021-10-15 LAB — PSA: Prostate Specific Ag, Serum: 2 ng/mL (ref 0.0–4.0)

## 2021-10-15 LAB — URIC ACID: Uric Acid: 5.9 mg/dL (ref 3.8–8.4)

## 2021-10-15 LAB — VITAMIN D 25 HYDROXY (VIT D DEFICIENCY, FRACTURES): Vit D, 25-Hydroxy: 29 ng/mL — ABNORMAL LOW (ref 30.0–100.0)

## 2021-10-15 MED ORDER — VITAMIN D 25 MCG (1000 UNIT) PO TABS: 1000.0000 [IU] | ORAL_TABLET | Freq: Every day | ORAL | 3 refills | Status: AC

## 2022-01-14 ENCOUNTER — Encounter: Payer: Self-pay | Admitting: Medical

## 2022-01-14 ENCOUNTER — Ambulatory Visit: Payer: BC Managed Care – PPO | Admitting: Medical

## 2022-01-14 VITALS — BP 124/80 | HR 83 | Wt 178.8 lb

## 2022-01-14 DIAGNOSIS — E559 Vitamin D deficiency, unspecified: Secondary | ICD-10-CM

## 2022-01-14 DIAGNOSIS — R748 Abnormal levels of other serum enzymes: Secondary | ICD-10-CM

## 2022-01-14 DIAGNOSIS — R7301 Impaired fasting glucose: Secondary | ICD-10-CM | POA: Diagnosis not present

## 2022-01-14 DIAGNOSIS — R200 Anesthesia of skin: Secondary | ICD-10-CM

## 2022-01-14 DIAGNOSIS — E782 Mixed hyperlipidemia: Secondary | ICD-10-CM | POA: Diagnosis not present

## 2022-01-14 DIAGNOSIS — R109 Unspecified abdominal pain: Secondary | ICD-10-CM

## 2022-01-14 DIAGNOSIS — R0609 Other forms of dyspnea: Secondary | ICD-10-CM | POA: Diagnosis not present

## 2022-01-14 NOTE — Assessment & Plan Note (Signed)
Compliant with supplement, recheck labs today

## 2022-01-14 NOTE — Assessment & Plan Note (Signed)
We discussed possible causes of elevated ALP.  Likely due to vitamin D deficiency.  Recheck labs today.

## 2022-01-14 NOTE — Progress Notes (Signed)
Subjective:  Justin Sellers is a 61 y.o. male who presents for Chief Complaint  Patient presents with   follow-up on labs    Follow-up on labs. Had plain black coffee     Here for follow-up from physical in February 2023.  At that time he had mixed dyslipidemia, impaired glucose, vitamin D deficiency, elevated alkaline phosphatase on labs.  He didn't start vitamin D supplement but is drinking a lot of 2% mild with vit D added daily.  He is working on healthier eating habits.  Works at [employer], walking all the time.  Does some exercise at home with virtual reality with his children.  Gets out of breath a little with exercise, particularly with walking stairs . Goes up and down 3 flights of stairs multiple times per day.  No chest pain, no other SOB, but can get a little winded with stairs.     Gets some numbness in great toes bilat.  Rest of toes are ok.    Had a pain recently this past weekend of RLQ.  Was intense for a few days.   though it was gas.  Has resolved at this point.  He denies having nausea, vomiting, diarrhea or change in bowels.    Fasting today to recheck labs.  No other aggravating or relieving factors.    No other c/o.  Past Medical History:  Diagnosis Date   Chronic headache    Depression    at age [age]   Full dentures    GERD (gastroesophageal reflux disease)    on meds   Wears glasses    Past Surgical History:  Procedure Laterality Date   COLONOSCOPY  05/22/2013   TA, semi peduc polyp, tics; Dr. Deatra Ina; repeat in 5 years   COLONOSCOPY  2021   hyperplastic polyp, repeat 10 years; Dr. Wilfrid Lund   FRACTURE SURGERY  1970   left arm   POLYPECTOMY     WISDOM TOOTH EXTRACTION       The following portions of the patient's history were reviewed and updated as appropriate: allergies, current medications, past family history, past medical history, past social history, past surgical history and problem list.  ROS Otherwise as in subjective  above    Objective: BP 124/80   Pulse 83   Wt 178 lb 12.8 oz (81.1 kg)   BMI 26.40 kg/m   General appearence: alert, no distress, WD/WN,  Neck: supple, no lymphadenopathy, no thyromegaly, no masses Heart: RRR, normal S1, S2, no murmurs Lungs: CTA bilaterally, no wheezes, rhonchi, or rales Abdomen: +bs, soft, tender LUQ, LLQ, RLQ, otherwise  non tender, non distended, no masses, no hepatomegaly, no splenomegaly, no rebound Pulses: 2+ symmetric, upper and lower extremities, normal cap refill Feet neurovascularly intact with normal ROM.  No particularly flat arches   EKG Reviewed, no new changes    Assessment: Encounter Diagnoses  Name Primary?   Alkaline phosphatase elevation Yes   Vitamin D deficiency    Mixed dyslipidemia    Impaired fasting blood sugar    DOE (dyspnea on exertion)    Numbness of toes    Abdominal pain, unspecified abdominal location      Plan: Problem List Items Addressed This Visit     Alkaline phosphatase elevation - Primary    We discussed possible causes of elevated ALP.  Likely due to vitamin D deficiency.  Recheck labs today.       Relevant Orders   Hepatic function panel   Vitamin D  deficiency    Compliant with supplement, recheck labs today       Relevant Orders   VITAMIN D 25 Hydroxy (Vit-D Deficiency, Fractures)   Mixed dyslipidemia    Counseled on diet, labs from February 2023, other treatment options including medication       Impaired fasting blood sugar    We discussed lab findings from February 2023, we discussed the findings of prediabetes/impaired glucose.  Counseled on diet and exercise and prevention of progression to diabetes       Relevant Orders   Hemoglobin A1c   DOE (dyspnea on exertion)   Relevant Orders   EKG 12-Lead (Completed)   Numbness of toes    Discussed possible causes.  I suspect this is from frequent walking on hard concrete for hours at work over time.  We discussed trying different shoes with  better arch support and more cushion on the bottom.  Or consider inserts for arch support as a trial.       Other Visit Diagnoses     Abdominal pain, unspecified abdominal location         Abdominal pain - discussed possible differential.   avoid gas causing foods, use light BRAT type diet over the next few days.   if resolving, then no need for follow up.  If worsening , consider CT scan abdomen pelvis.     Follow up: pending labs

## 2022-01-14 NOTE — Assessment & Plan Note (Signed)
Counseled on diet, labs from February 2023, other treatment options including medication

## 2022-01-14 NOTE — Assessment & Plan Note (Signed)
We discussed lab findings from February 2023, we discussed the findings of prediabetes/impaired glucose.  Counseled on diet and exercise and prevention of progression to diabetes

## 2022-01-14 NOTE — Assessment & Plan Note (Signed)
Discussed possible causes.  I suspect this is from frequent walking on hard concrete for hours at work over time.  We discussed trying different shoes with better arch support and more cushion on the bottom.  Or consider inserts for arch support as a trial.

## 2022-01-15 LAB — HEMOGLOBIN A1C
Est. average glucose Bld gHb Est-mCnc: 140 mg/dL
Hgb A1c MFr Bld: 6.5 % — ABNORMAL HIGH (ref 4.8–5.6)

## 2022-01-15 LAB — HEPATIC FUNCTION PANEL
ALT: 34 IU/L (ref 0–44)
AST: 28 IU/L (ref 0–40)
Albumin: 4.6 g/dL (ref 3.8–4.9)
Alkaline Phosphatase: 123 IU/L — ABNORMAL HIGH (ref 44–121)
Bilirubin Total: 0.5 mg/dL (ref 0.0–1.2)
Bilirubin, Direct: 0.15 mg/dL (ref 0.00–0.40)
Total Protein: 6.7 g/dL (ref 6.0–8.5)

## 2022-01-15 LAB — VITAMIN D 25 HYDROXY (VIT D DEFICIENCY, FRACTURES): Vit D, 25-Hydroxy: 24.5 ng/mL — ABNORMAL LOW (ref 30.0–100.0)

## 2022-02-04 IMAGING — DX DG CHEST 2V
2 series · 2 of 2 positions shown · non-contrast
Comparison: 03/31/16

CLINICAL DATA: Productive cough

EXAM:
CHEST - 2 VIEW

[dg chest 2 view (1 of 2)]
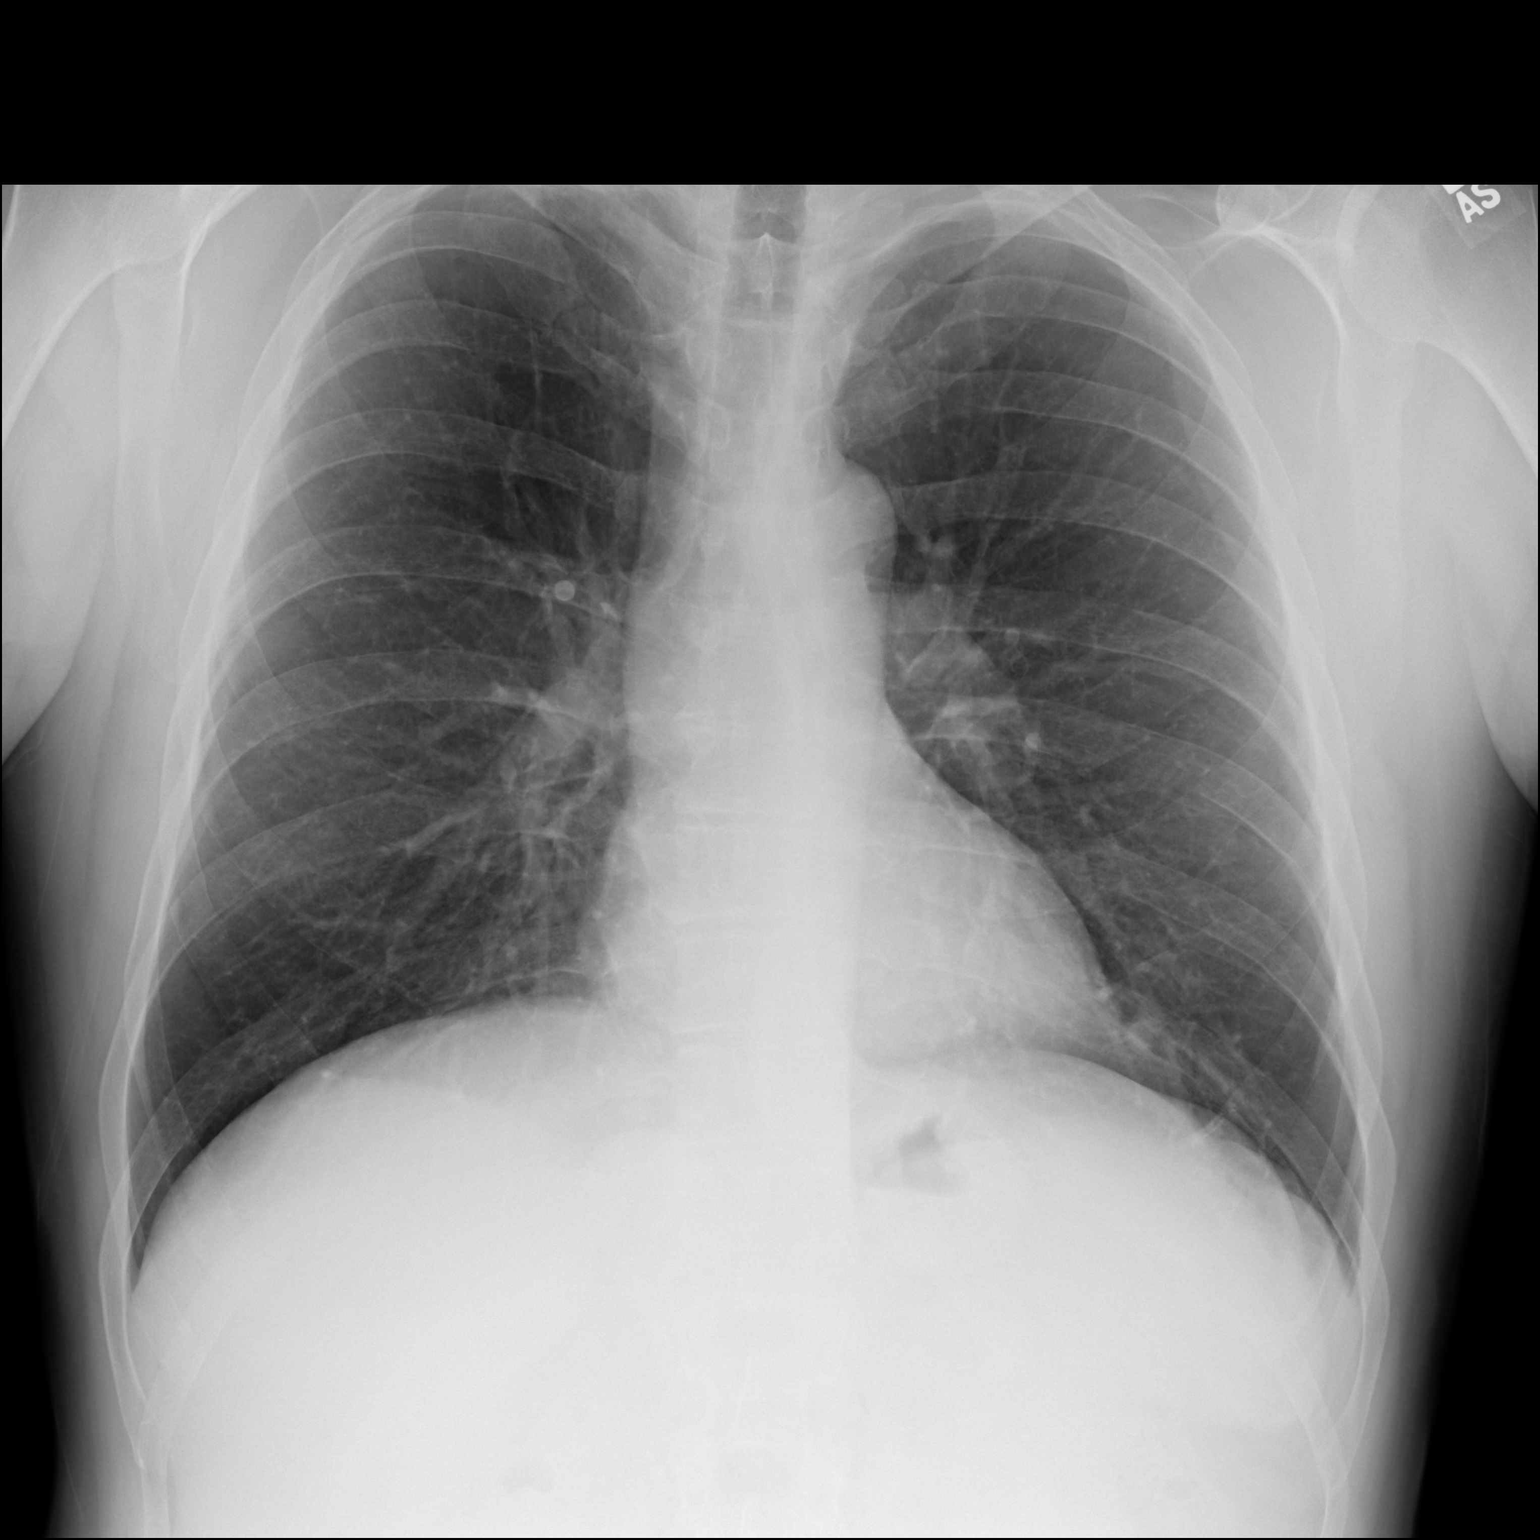

[dg chest 2 view (2 of 2)]
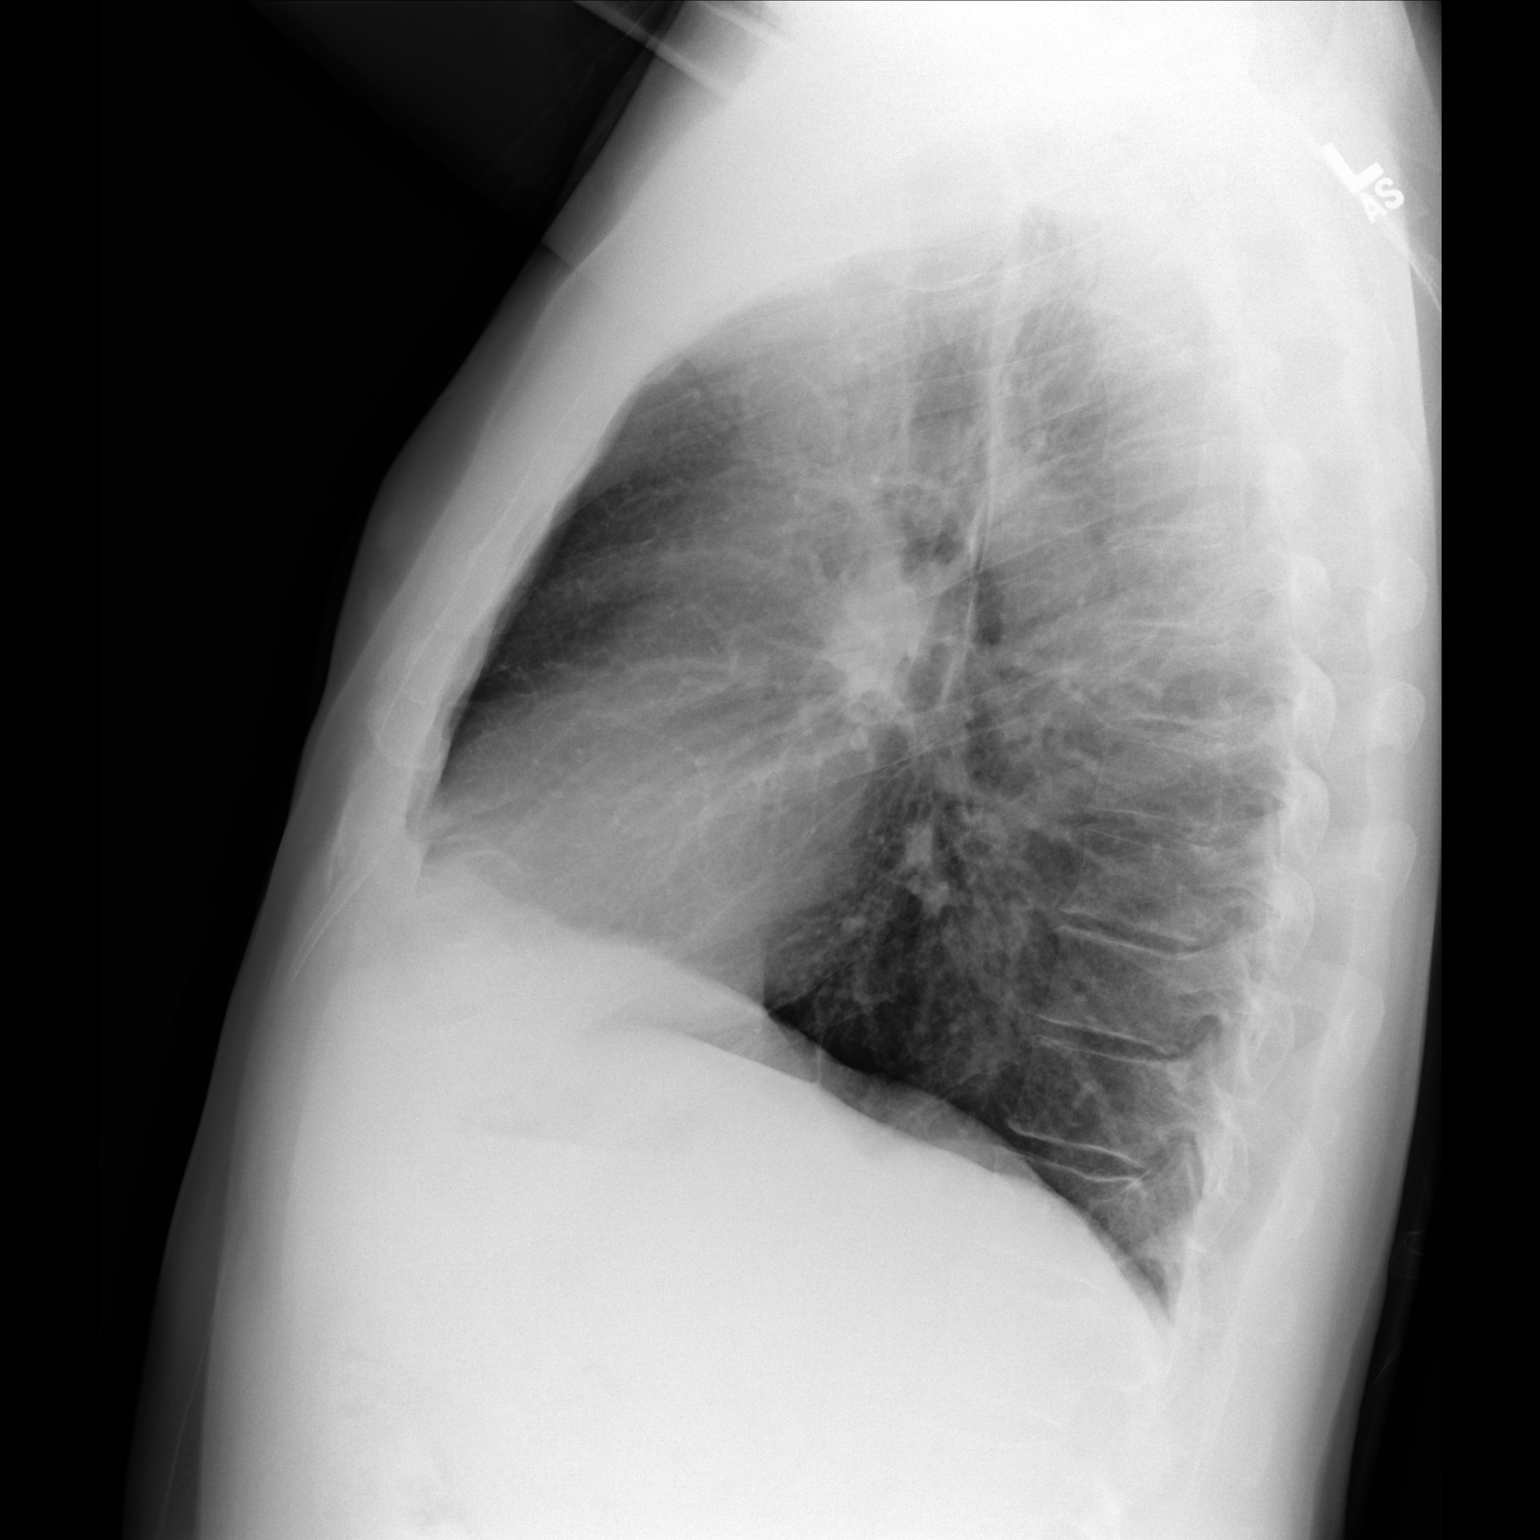

[2 of 2 positions shown; findings below may reference images not displayed]

FINDINGS: Cardiac shadow is within normal limits. The lungs are clear.
Calcified granuloma is noted in the left lung base. No bony
abnormality is seen.
IMPRESSION: No acute abnormality noted.

## 2022-04-20 ENCOUNTER — Encounter: Payer: Self-pay | Admitting: Family Medicine

## 2022-04-20 ENCOUNTER — Ambulatory Visit (INDEPENDENT_AMBULATORY_CARE_PROVIDER_SITE_OTHER): Payer: BC Managed Care – PPO | Admitting: Family Medicine

## 2022-04-20 VITALS — BP 106/70 | HR 78 | Temp 97.0°F | Wt 174.6 lb

## 2022-04-20 DIAGNOSIS — K5790 Diverticulosis of intestine, part unspecified, without perforation or abscess without bleeding: Secondary | ICD-10-CM | POA: Diagnosis not present

## 2022-04-20 DIAGNOSIS — R109 Unspecified abdominal pain: Secondary | ICD-10-CM

## 2022-04-20 DIAGNOSIS — M545 Low back pain, unspecified: Secondary | ICD-10-CM | POA: Diagnosis not present

## 2022-04-20 LAB — POCT URINALYSIS DIP (PROADVANTAGE DEVICE)
Bilirubin, UA: NEGATIVE
Blood, UA: NEGATIVE
Glucose, UA: NEGATIVE mg/dL
Ketones, POC UA: NEGATIVE mg/dL
Leukocytes, UA: NEGATIVE
Nitrite, UA: NEGATIVE
Protein Ur, POC: NEGATIVE mg/dL
Specific Gravity, Urine: 1.005
Urobilinogen, Ur: 0.2
pH, UA: 5 (ref 5.0–8.0)

## 2022-04-20 NOTE — Addendum Note (Signed)
Addended by: Elyse Jarvis on: 04/20/2022 02:34 PM   Modules accepted: Orders

## 2022-04-20 NOTE — Patient Instructions (Signed)
Further right-sided back pain, I want you to use heat 20 minutes 3 times per day, Tylenol 2 tablets 4 times per day as well as 2 Aleve twice per day.  Gentle stretching will help.  Proper posturing is also important For the lower abdominal pain, use MiraLAX on a daily basis

## 2022-04-20 NOTE — Progress Notes (Signed)
   Subjective:    Patient ID: Justin Sellers, male    DOB: 14-Dec-1960, 61 y.o.   MRN: [id number]  HPI He is here for evaluation of 2 issues.  He states that he injured his back at age 86 when he twisted the wrong way.  He done well on it until yesterday when he twisted again and now is complaining of right mid lumbar pain especially when he rotates.  He has been using Doans pills with some relief of his symptoms.  No radiation of the pain.  No weakness numbness or tingling. He has also had continued difficulty since May of right lower quadrant discomfort that sometimes radiates to the left.  This bothers him more at work but can also occur at home.  He states that when he urinates he can relieve the pain and he also notes that when he has a BM the pain will decrease.  He notes constipation and states that when he has a BM the pain is relieved.  He does have a history of colonic polyps as well as diverticulosis   Review of Systems     Objective:   Physical Exam Exam of his back does show some right paravertebral muscle tenderness and slight spasm.  Pain on motion of the back especially with rotation.  Abdominal exam shows decreased bowel sounds without masses or tenderness.       Assessment & Plan:  Abdominal pain, unspecified abdominal location  Diverticulosis  Acute right-sided low back pain without sciatica I explained that I think the abdominal pain is probably constipation related and recommend that he use MiraLAX on a regular basis for the next month or so.  If he continues to have difficulty with the pain in spite of normal BMs, referral will be made.  Explained that the colonoscopy was essentially negative and diverticulosis should not cause this. I will treat the mid back pain conservatively with heat, stretching, Tylenol/NSAID.  Discussed the use of muscle relaxer but at this point he is comfortable without that.

## 2022-04-29 ENCOUNTER — Encounter: Payer: Self-pay | Admitting: Internal Medicine

## 2022-06-02 ENCOUNTER — Encounter: Payer: Self-pay | Admitting: Internal Medicine

## 2022-10-15 ENCOUNTER — Encounter: Payer: BC Managed Care – PPO | Admitting: Medical

## 2022-11-02 DIAGNOSIS — Z125 Encounter for screening for malignant neoplasm of prostate: Secondary | ICD-10-CM | POA: Diagnosis not present

## 2022-11-02 DIAGNOSIS — E559 Vitamin D deficiency, unspecified: Secondary | ICD-10-CM | POA: Diagnosis not present

## 2022-11-02 DIAGNOSIS — E7800 Pure hypercholesterolemia, unspecified: Secondary | ICD-10-CM | POA: Diagnosis not present

## 2022-11-02 DIAGNOSIS — J309 Allergic rhinitis, unspecified: Secondary | ICD-10-CM | POA: Diagnosis not present

## 2022-11-02 DIAGNOSIS — E118 Type 2 diabetes mellitus with unspecified complications: Secondary | ICD-10-CM | POA: Diagnosis not present

## 2022-11-02 DIAGNOSIS — Z0000 Encounter for general adult medical examination without abnormal findings: Secondary | ICD-10-CM | POA: Diagnosis not present

## 2022-11-02 DIAGNOSIS — K219 Gastro-esophageal reflux disease without esophagitis: Secondary | ICD-10-CM | POA: Diagnosis not present

## 2022-11-03 ENCOUNTER — Other Ambulatory Visit: Payer: Self-pay | Admitting: Physician Assistant

## 2022-11-03 DIAGNOSIS — K4090 Unilateral inguinal hernia, without obstruction or gangrene, not specified as recurrent: Secondary | ICD-10-CM

## 2022-11-04 ENCOUNTER — Telehealth: Payer: Self-pay | Admitting: Medical

## 2022-11-04 NOTE — Telephone Encounter (Signed)
[facility] medical record request forwarded to [facility]

## 2022-11-19 ENCOUNTER — Ambulatory Visit
Admission: RE | Admit: 2022-11-19 | Discharge: 2022-11-19 | Disposition: A | Discharge status: Home / Self Care | Payer: BC Managed Care – PPO | Source: Ambulatory Visit | Attending: Physician Assistant | Admitting: Physician Assistant

## 2022-11-19 DIAGNOSIS — K4090 Unilateral inguinal hernia, without obstruction or gangrene, not specified as recurrent: Secondary | ICD-10-CM

## 2022-11-19 DIAGNOSIS — K429 Umbilical hernia without obstruction or gangrene: Secondary | ICD-10-CM | POA: Diagnosis not present

## 2022-12-21 DIAGNOSIS — E7800 Pure hypercholesterolemia, unspecified: Secondary | ICD-10-CM | POA: Diagnosis not present

## 2023-02-09 ENCOUNTER — Ambulatory Visit: Payer: Self-pay | Admitting: Surgery

## 2023-02-09 DIAGNOSIS — K4090 Unilateral inguinal hernia, without obstruction or gangrene, not specified as recurrent: Secondary | ICD-10-CM | POA: Diagnosis not present

## 2023-04-08 DIAGNOSIS — R42 Dizziness and giddiness: Secondary | ICD-10-CM | POA: Diagnosis not present

## 2023-04-08 DIAGNOSIS — R110 Nausea: Secondary | ICD-10-CM | POA: Diagnosis not present

## 2023-05-03 DIAGNOSIS — K219 Gastro-esophageal reflux disease without esophagitis: Secondary | ICD-10-CM | POA: Diagnosis not present

## 2023-05-03 DIAGNOSIS — E118 Type 2 diabetes mellitus with unspecified complications: Secondary | ICD-10-CM | POA: Diagnosis not present

## 2023-05-03 DIAGNOSIS — E7800 Pure hypercholesterolemia, unspecified: Secondary | ICD-10-CM | POA: Diagnosis not present

## 2023-05-03 DIAGNOSIS — E559 Vitamin D deficiency, unspecified: Secondary | ICD-10-CM | POA: Diagnosis not present

## 2023-06-14 ENCOUNTER — Ambulatory Visit: Payer: Self-pay | Admitting: Surgery

## 2023-06-14 DIAGNOSIS — K4090 Unilateral inguinal hernia, without obstruction or gangrene, not specified as recurrent: Secondary | ICD-10-CM | POA: Diagnosis not present

## 2023-06-14 NOTE — H&P (Signed)
Chief Complaint: Follow-up     History of Present Illness: Justin Sellers is a 62 y.o. male who is seen today for follow-up of right inguinal hernia.  He desires repair.  He has had no new symptoms except continued right groin pain..     Review of Systems: A complete review of systems was obtained from the patient.  I have reviewed this information and discussed as appropriate with the patient.  See HPI as well for other ROS.      Medical History: History reviewed. No pertinent past medical history.  There is no problem list on file for this patient.   History reviewed. No pertinent surgical history.   No Known Allergies  Current Outpatient Medications on File Prior to Visit  Medication Sig Dispense Refill   atorvastatin (LIPITOR) 20 MG tablet Take 20 mg by mouth once daily     esomeprazole (NEXIUM) 40 MG DR capsule Take 40 mg by mouth once daily     No current facility-administered medications on file prior to visit.    History reviewed. No pertinent family history.   Social History   Tobacco Use  Smoking Status Former   Types: Cigarettes   Start date: 2011  Smokeless Tobacco Never     Social History   Socioeconomic History   Marital status: Married  Tobacco Use   Smoking status: Former    Types: Cigarettes    Start date: 2011   Smokeless tobacco: Never  Substance and Sexual Activity   Alcohol use: Never   Drug use: Never    Objective:    Vitals:   06/14/23 1336  PainSc: 0-No pain    There is no height or weight on file to calculate BMI.  Abdomen: Reducible right inguinal hernia moderate size mild discomfort no evidence of left inguinal hernia otherwise soft nontender        Assessment and Plan:     Diagnoses and all orders for this visit:  Right inguinal hernia     Patient desires repair of his right inguinal hernia due to pain.  Risks and benefits reviewed.  Plan will be for open repair of right inguinal hernia with mesh.  Risk of  bleeding, infection, chronic pain, recurrence, testicle loss, testicle damage, injury to major vascular structure, injury to neighboring structures or other internal organs, anesthesia risk, DVT, and the need for the treatment standard procedures reviewed today.  No follow-ups on file.   Hayden Rasmussen, MD

## 2024-05-05 ENCOUNTER — Encounter (INDEPENDENT_AMBULATORY_CARE_PROVIDER_SITE_OTHER): Payer: Self-pay

## 2024-05-17 ENCOUNTER — Ambulatory Visit (INDEPENDENT_AMBULATORY_CARE_PROVIDER_SITE_OTHER): Admitting: Otolaryngology

## 2024-05-17 ENCOUNTER — Encounter (INDEPENDENT_AMBULATORY_CARE_PROVIDER_SITE_OTHER): Payer: Self-pay | Admitting: Otolaryngology

## 2024-05-17 VITALS — BP 124/77 | HR 105 | Temp 98.6°F | Ht 69.0 in | Wt 179.0 lb

## 2024-05-17 DIAGNOSIS — R09A2 Foreign body sensation, throat: Secondary | ICD-10-CM | POA: Diagnosis not present

## 2024-05-17 DIAGNOSIS — K219 Gastro-esophageal reflux disease without esophagitis: Secondary | ICD-10-CM

## 2024-05-17 DIAGNOSIS — J3089 Other allergic rhinitis: Secondary | ICD-10-CM

## 2024-05-17 DIAGNOSIS — R0982 Postnasal drip: Secondary | ICD-10-CM

## 2024-05-17 DIAGNOSIS — R0981 Nasal congestion: Secondary | ICD-10-CM | POA: Diagnosis not present

## 2024-05-17 DIAGNOSIS — J343 Hypertrophy of nasal turbinates: Secondary | ICD-10-CM

## 2024-05-17 DIAGNOSIS — R0989 Other specified symptoms and signs involving the circulatory and respiratory systems: Secondary | ICD-10-CM

## 2024-05-17 MED ORDER — IPRATROPIUM BROMIDE 0.03 % NA SOLN: 2.0000 | Freq: Two times a day (BID) | NASAL | 12 refills | Status: AC

## 2024-05-17 MED ORDER — FAMOTIDINE 20 MG PO TABS: 20.0000 mg | ORAL_TABLET | Freq: Two times a day (BID) | ORAL | 3 refills | Status: AC

## 2024-05-17 MED ORDER — LEVOCETIRIZINE DIHYDROCHLORIDE 5 MG PO TABS: 5.0000 mg | ORAL_TABLET | Freq: Every evening | ORAL | 3 refills | Status: AC

## 2024-05-17 MED ORDER — FLUTICASONE PROPIONATE 50 MCG/ACT NA SUSP: 2.0000 | Freq: Every day | NASAL | 6 refills | Status: AC

## 2024-05-17 NOTE — Progress Notes (Signed)
 ENT CONSULT:  Reason for Consult: chronic nasal congestion    HPI: Discussed the use of AI scribe software for clinical note transcription with the patient, who gave verbal consent to proceed.  History of Present Illness Justin Sellers is a 63 year old male who presents with chronic nasal congestion and drainage.  He has experienced chronic nasal congestion and nasal drainage for the past seven years, with symptoms intensifying between 2 and 3 AM, causing him to wake up and expectorate. No history of trouble with swallowing, itchy eyes, or sneezing. He describes the condition as continuous and bothersome, stating 'it's driving me up the wall.'  He has been self-medicating with over-the-counter medications including Claritin, a sinus medicine from [employer], and Mucinex, but these have not completely alleviated his symptoms. He has not experienced any relief from these treatments.  He reports the onset of heartburn or reflux coinciding with the start of his nasal symptoms seven years ago. The nasal drainage seems to worsen when lying on his right side, and is more manageable when lying on his left side.  No history of allergies, itchy eyes, sneezing, or swallowing difficulties.     Records Reviewed:  Office visit with PCP Worth Turmel  05/04/24 FAXED 05/04/24 - Referral to ENT for evaluation of chronic rhinitis, failed allergy  medicine and steroid nasal spray    Seen for GERD Past Medical History:  Diagnosis Date   Chronic headache    Depression    at age [age]   Full dentures    GERD (gastroesophageal reflux disease)    on meds   Wears glasses     Past Surgical History:  Procedure Laterality Date   COLONOSCOPY  05/22/2013   TA, semi peduc polyp, tics; Dr. Debrah; repeat in 5 years   COLONOSCOPY  2021   hyperplastic polyp, repeat 10 years; Dr. Victory Brand   FRACTURE SURGERY  1970   left arm   POLYPECTOMY     WISDOM TOOTH EXTRACTION      Family History  Problem Relation  Age of Onset   Heart disease Mother 77       CABG   Diabetes Mother    Hypertension Father    Lung cancer Father    Thyroid disease Sister    Thyroid disease Sister    Heart disease Paternal Grandmother    Lung cancer Paternal Grandfather    Colon cancer Neg Hx    Esophageal cancer Neg Hx    Stomach cancer Neg Hx    Rectal cancer Neg Hx    Colon polyps Neg Hx     Social History:  reports that he quit smoking about 13 years ago. His smoking use included cigarettes. He started smoking about 14 years ago. He has a 24 pack-year smoking history. He has never used smokeless tobacco. He reports that he does not drink alcohol and does not use drugs.  Allergies: No Known Allergies  Medications: I have reviewed the patient's current medications.  The PMH, PSH, Medications, Allergies, and SH were reviewed and updated.  ROS: Constitutional: Negative for fever, weight loss and weight gain. Cardiovascular: Negative for chest pain and dyspnea on exertion. Respiratory: Is not experiencing shortness of breath at rest. Gastrointestinal: Negative for nausea and vomiting. Neurological: Negative for headaches. Psychiatric: The patient is not nervous/anxious  Blood pressure 124/77, pulse (!) 105, temperature 98.6 F (37 C), temperature source Oral, height 5' 9 (1.753 m), weight 179 lb (81.2 kg), SpO2 92%. Body mass index is 26.43  kg/m.  PHYSICAL EXAM:  Exam: General: Well-developed, well-nourished Communication and Voice: raspy Respiratory Respiratory effort: Equal inspiration and expiration without stridor Cardiovascular Peripheral Vascular: Warm extremities with equal color/perfusion Eyes: No nystagmus with equal extraocular motion bilaterally Neuro/Psych/Balance: Patient oriented to person, place, and time; Appropriate mood and affect; Gait is intact with no imbalance; Cranial nerves I-XII are intact Head and Face Inspection: Normocephalic and atraumatic without mass or  lesion Palpation: Facial skeleton intact without bony stepoffs Salivary Glands: No mass or tenderness Facial Strength: Facial motility symmetric and full bilaterally ENT Pinna: External ear intact and fully developed External canal: Canal is patent with intact skin Tympanic Membrane: Clear and mobile External Nose: No scar or anatomic deformity Internal Nose: Septum is straight. No polyp, or purulence. Mucosal edema and erythema present.  Bilateral inferior turbinate hypertrophy.  Lips, Teeth, and gums: Mucosa and teeth intact and viable TMJ: No pain to palpation with full mobility Oral cavity/oropharynx: No erythema or exudate, no lesions present Nasopharynx: No mass or lesion with intact mucosa Hypopharynx: Intact mucosa without pooling of secretions Larynx Glottic: Full true vocal cord mobility without lesion or mass Supraglottic: Normal appearing epiglottis and AE folds Interarytenoid Space: Moderate pachydermia&edema Subglottic Space: Patent without lesion or edema Neck Neck and Trachea: Midline trachea without mass or lesion Thyroid: No mass or nodularity Lymphatics: No lymphadenopathy  Procedure: Preoperative diagnosis: cough and phlegm in the throat  Postoperative diagnosis:   Same + GERD LPR  Procedure: Flexible fiberoptic laryngoscopy  Surgeon: Ronnesha Mester, MD  Anesthesia: Topical lidocaine and Afrin Complications: None Condition is stable throughout exam  Indications and consent:  The patient presents to the clinic with above symptoms. Indirect laryngoscopy view was incomplete. Thus it was recommended that they undergo a flexible fiberoptic laryngoscopy. All of the risks, benefits, and potential complications were reviewed with the patient preoperatively and verbal informed consent was obtained.  Procedure: The patient was seated upright in the clinic. Topical lidocaine and Afrin were applied to the nasal cavity. After adequate anesthesia had occurred, I then  proceeded to pass the flexible telescope into the nasal cavity. The nasal cavity was patent without rhinorrhea or polyp. The nasopharynx was also patent without mass or lesion. The base of tongue was visualized and was normal. There were no signs of pooling of secretions in the piriform sinuses. The true vocal folds were mobile bilaterally. There were no signs of glottic or supraglottic mucosal lesion or mass. There was moderate interarytenoid pachydermia and post cricoid edema. The telescope was then slowly withdrawn and the patient tolerated the procedure throughout.     PROCEDURE NOTE: nasal endoscopy  Preoperative diagnosis: chronic sinusitis symptoms  Postoperative diagnosis: same  Procedure: Diagnostic nasal endoscopy (68768)  Surgeon: Elena Larry, M.D.  Anesthesia: Topical lidocaine and Afrin  H&P REVIEW: The patient's history and physical were reviewed today prior to procedure. All medications were reviewed and updated as well. Complications: None Condition is stable throughout exam Indications and consent: The patient presents with symptoms of chronic sinusitis not responding to previous therapies. All the risks, benefits, and potential complications were reviewed with the patient preoperatively and informed consent was obtained. The time out was completed with confirmation of the correct procedure.   Procedure: The patient was seated upright in the clinic. Topical lidocaine and Afrin were applied to the nasal cavity. After adequate anesthesia had occurred, the rigid nasal endoscope was passed into the nasal cavity. The nasal mucosa, turbinates, septum, and sinus drainage pathways were visualized bilaterally. This revealed  no purulence or significant secretions that might be cultured. There were no polyps or sites of significant inflammation. The mucosa was intact and there was no crusting present. The scope was then slowly withdrawn and the patient tolerated the procedure well.  There were no complications or blood loss.    Studies Reviewed: CXR 03/10/21 Normal study  Assessment/Plan: Encounter Diagnoses  Name Primary?   Chronic nasal congestion Yes   Environmental and seasonal allergies    Post-nasal drip    Hypertrophy of both inferior nasal turbinates    Chronic GERD    Phlegm in throat    Globus sensation     Assessment and Plan Assessment & Plan Chronic nasal congestion with postnasal drainage Chronic nasal congestion and drainage likely due to allergies vs vasomotor rhinitis. Symptoms correlate with heartburn or reflux as well since he started to have heartburn at the same time. No anatomical blockage or bacterial infection noted on nasal endoscopy today. No significant septal deviation - Prescribed Flonase  nasal spray twice daily. - Prescribed Ipratropium bromide  nasal spray up to four times daily. - Prescribed Xyzal  at night instead of Claritin. - Recommend nasal saline rinse once daily with boiled or distilled water and kosher salt if necessary. - Provide picture of nasal saline rinse bottle for purchase. - Offer allergy  testing if symptoms persist, currently declined.  GERD LPR Globus phlegm sensation in his throat - Pepcid  20 mg BID  -  Reflux Gourmet after meals - diet and lifestyle changes to minimize GERD - Refer to [employer] blog for dietary and lifestyle modifications/reflux cook book    Thank you for allowing me to participate in the care of this patient. Please do not hesitate to contact me with any questions or concerns.   Elena Larry, MD Otolaryngology [facility] Phone: [phone] Fax: [phone]    05/17/2024, 10:59 AM

## 2024-05-17 NOTE — Patient Instructions (Addendum)
 [facility] Med Nasal Saline Rinse   - start nasal saline rinses with NeilMed Bottle available over the counter or online to help with nasal congestion      [email or web address] - check out this website to learn more about reflux   -Avoid lying down for at least two hours after a meal or after drinking acidic beverages, like soda, or other caffeinated beverages. This can help to prevent stomach contents from flowing back into the esophagus. -Keep your head elevated while you sleep. Using an extra pillow or two can also help to prevent reflux. -Eat smaller and more frequent meals each day instead of a few large meals. This promotes digestion and can aid in preventing heartburn. -Wear loose-fitting clothes to ease pressure on the stomach, which can worsen heartburn and reflux. -Reduce excess weight around the midsection. This can ease pressure on the stomach. Such pressure can force some stomach contents back up the esophagus

## 2024-07-27 ENCOUNTER — Telehealth (INDEPENDENT_AMBULATORY_CARE_PROVIDER_SITE_OTHER): Payer: Self-pay

## 2024-07-27 NOTE — Telephone Encounter (Signed)
 Patient's pharmacy faxed over a drug change request. I called patient to confirm the change before getting it sent it. Patients phone went straight to voicemail. I was unable to leave a voicemail due to patient's voicemail being full.
